# Patient Record
Sex: Female | Born: 1960 | Race: White | Hispanic: No | Smoking: Current every day smoker
Health system: Southern US, Community
[De-identification: ages and names within clinical notes are randomized; demographics above are authoritative.]

## PROBLEM LIST (undated history)

## (undated) DIAGNOSIS — R51 Headache: Secondary | ICD-10-CM

## (undated) DIAGNOSIS — I639 Cerebral infarction, unspecified: Secondary | ICD-10-CM

## (undated) DIAGNOSIS — Z5189 Encounter for other specified aftercare: Secondary | ICD-10-CM

## (undated) DIAGNOSIS — IMO0002 Reserved for concepts with insufficient information to code with codable children: Secondary | ICD-10-CM

## (undated) DIAGNOSIS — I10 Essential (primary) hypertension: Secondary | ICD-10-CM

## (undated) DIAGNOSIS — C539 Malignant neoplasm of cervix uteri, unspecified: Secondary | ICD-10-CM

## (undated) DIAGNOSIS — D126 Benign neoplasm of colon, unspecified: Secondary | ICD-10-CM

## (undated) DIAGNOSIS — G8929 Other chronic pain: Secondary | ICD-10-CM

## (undated) DIAGNOSIS — I2510 Atherosclerotic heart disease of native coronary artery without angina pectoris: Secondary | ICD-10-CM

## (undated) DIAGNOSIS — T7840XA Allergy, unspecified, initial encounter: Secondary | ICD-10-CM

## (undated) DIAGNOSIS — R519 Headache, unspecified: Secondary | ICD-10-CM

## (undated) DIAGNOSIS — E785 Hyperlipidemia, unspecified: Secondary | ICD-10-CM

## (undated) DIAGNOSIS — I7409 Other arterial embolism and thrombosis of abdominal aorta: Secondary | ICD-10-CM

## (undated) HISTORY — DX: Encounter for other specified aftercare: Z51.89

## (undated) HISTORY — DX: Headache: R51

## (undated) HISTORY — DX: Allergy, unspecified, initial encounter: T78.40XA

## (undated) HISTORY — DX: Cerebral infarction, unspecified: I63.9

## (undated) HISTORY — DX: Headache, unspecified: R51.9

## (undated) HISTORY — DX: Hyperlipidemia, unspecified: E78.5

## (undated) HISTORY — PX: CARDIAC VALVE SURGERY: SHX40

## (undated) HISTORY — DX: Essential (primary) hypertension: I10

## (undated) HISTORY — DX: Reserved for concepts with insufficient information to code with codable children: IMO0002

## (undated) HISTORY — DX: Other chronic pain: G89.29

## (undated) HISTORY — DX: Malignant neoplasm of cervix uteri, unspecified: C53.9

## (undated) HISTORY — PX: EYE SURGERY: SHX253

## (undated) HISTORY — PX: COLONOSCOPY: SHX174

## (undated) HISTORY — PX: ABDOMINAL HYSTERECTOMY: SHX81

## (undated) HISTORY — DX: Benign neoplasm of colon, unspecified: D12.6

---

## 2001-10-22 ENCOUNTER — Emergency Department (HOSPITAL_COMMUNITY): Admission: EM | Admit: 2001-10-22 | Discharge: 2001-10-22 | Payer: Self-pay | Admitting: Emergency Medicine

## 2003-01-03 ENCOUNTER — Emergency Department (HOSPITAL_COMMUNITY): Admission: EM | Admit: 2003-01-03 | Discharge: 2003-01-03 | Payer: Self-pay | Admitting: Emergency Medicine

## 2003-03-06 ENCOUNTER — Emergency Department (HOSPITAL_COMMUNITY): Admission: EM | Admit: 2003-03-06 | Discharge: 2003-03-06 | Payer: Self-pay | Admitting: Emergency Medicine

## 2003-03-07 ENCOUNTER — Encounter: Payer: Self-pay | Admitting: Emergency Medicine

## 2003-03-07 ENCOUNTER — Emergency Department (HOSPITAL_COMMUNITY): Admission: EM | Admit: 2003-03-07 | Discharge: 2003-03-07 | Payer: Self-pay | Admitting: Emergency Medicine

## 2003-11-06 ENCOUNTER — Emergency Department (HOSPITAL_COMMUNITY): Admission: EM | Admit: 2003-11-06 | Discharge: 2003-11-07 | Payer: Self-pay | Admitting: Emergency Medicine

## 2004-07-21 ENCOUNTER — Emergency Department (HOSPITAL_COMMUNITY): Admission: EM | Admit: 2004-07-21 | Discharge: 2004-07-22 | Payer: Self-pay | Admitting: Emergency Medicine

## 2004-07-22 ENCOUNTER — Emergency Department (HOSPITAL_COMMUNITY): Admission: EM | Admit: 2004-07-22 | Discharge: 2004-07-22 | Payer: Self-pay | Admitting: Emergency Medicine

## 2005-05-27 ENCOUNTER — Emergency Department (HOSPITAL_COMMUNITY): Admission: EM | Admit: 2005-05-27 | Discharge: 2005-05-27 | Payer: Self-pay | Admitting: Emergency Medicine

## 2005-08-06 ENCOUNTER — Emergency Department (HOSPITAL_COMMUNITY): Admission: EM | Admit: 2005-08-06 | Discharge: 2005-08-06 | Payer: Self-pay | Admitting: Emergency Medicine

## 2005-10-17 ENCOUNTER — Emergency Department (HOSPITAL_COMMUNITY): Admission: EM | Admit: 2005-10-17 | Discharge: 2005-10-17 | Payer: Self-pay | Admitting: Emergency Medicine

## 2008-03-04 ENCOUNTER — Emergency Department (HOSPITAL_COMMUNITY): Admission: EM | Admit: 2008-03-04 | Discharge: 2008-03-04 | Payer: Self-pay | Admitting: Emergency Medicine

## 2010-02-20 ENCOUNTER — Encounter: Admission: RE | Admit: 2010-02-20 | Discharge: 2010-02-20 | Payer: Self-pay | Admitting: Internal Medicine

## 2010-02-25 ENCOUNTER — Encounter: Admission: RE | Admit: 2010-02-25 | Discharge: 2010-02-25 | Payer: Self-pay | Admitting: Internal Medicine

## 2010-07-14 DIAGNOSIS — I639 Cerebral infarction, unspecified: Secondary | ICD-10-CM

## 2010-07-14 HISTORY — DX: Cerebral infarction, unspecified: I63.9

## 2010-12-07 ENCOUNTER — Inpatient Hospital Stay (HOSPITAL_COMMUNITY)
Admission: EM | Admit: 2010-12-07 | Discharge: 2010-12-09 | DRG: 948 | Disposition: A | Discharge status: Home / Self Care | Payer: Self-pay | Attending: Internal Medicine | Admitting: Internal Medicine

## 2010-12-07 ENCOUNTER — Emergency Department (HOSPITAL_COMMUNITY): Payer: Self-pay

## 2010-12-07 DIAGNOSIS — J309 Allergic rhinitis, unspecified: Secondary | ICD-10-CM | POA: Diagnosis present

## 2010-12-07 DIAGNOSIS — E876 Hypokalemia: Secondary | ICD-10-CM | POA: Diagnosis present

## 2010-12-07 DIAGNOSIS — Z8541 Personal history of malignant neoplasm of cervix uteri: Secondary | ICD-10-CM

## 2010-12-07 DIAGNOSIS — F17200 Nicotine dependence, unspecified, uncomplicated: Secondary | ICD-10-CM | POA: Diagnosis present

## 2010-12-07 DIAGNOSIS — I10 Essential (primary) hypertension: Secondary | ICD-10-CM | POA: Diagnosis present

## 2010-12-07 DIAGNOSIS — D72829 Elevated white blood cell count, unspecified: Secondary | ICD-10-CM | POA: Diagnosis present

## 2010-12-07 DIAGNOSIS — R413 Other amnesia: Principal | ICD-10-CM | POA: Diagnosis present

## 2010-12-07 LAB — COMPREHENSIVE METABOLIC PANEL
ALT: 8 U/L (ref 0–35)
AST: 16 U/L (ref 0–37)
Albumin: 4.1 g/dL (ref 3.5–5.2)
Alkaline Phosphatase: 121 U/L — ABNORMAL HIGH (ref 39–117)
BUN: 7 mg/dL (ref 6–23)
CO2: 23 mEq/L (ref 19–32)
Calcium: 9.4 mg/dL (ref 8.4–10.5)
Chloride: 97 mEq/L (ref 96–112)
Creatinine, Ser: 0.57 mg/dL (ref 0.4–1.2)
Glucose, Bld: 174 mg/dL — ABNORMAL HIGH (ref 70–99)
Potassium: 2.4 mEq/L — CL (ref 3.5–5.1)
Total Bilirubin: 0.4 mg/dL (ref 0.3–1.2)
Total Protein: 7 g/dL (ref 6.0–8.3)

## 2010-12-07 LAB — URINALYSIS, ROUTINE W REFLEX MICROSCOPIC
Bilirubin Urine: NEGATIVE
Ketones, ur: 15 mg/dL — AB
Leukocytes, UA: NEGATIVE
Nitrite: NEGATIVE
Protein, ur: NEGATIVE mg/dL
Specific Gravity, Urine: 1.015 (ref 1.005–1.030)
Urobilinogen, UA: 0.2 mg/dL (ref 0.0–1.0)
pH: 5.5 (ref 5.0–8.0)

## 2010-12-07 LAB — RAPID URINE DRUG SCREEN, HOSP PERFORMED
Benzodiazepines: NOT DETECTED
Cocaine: NOT DETECTED
Tetrahydrocannabinol: POSITIVE — AB

## 2010-12-07 LAB — DIFFERENTIAL
Basophils Absolute: 0 10*3/uL (ref 0.0–0.1)
Basophils Relative: 0 % (ref 0–1)
Eosinophils Relative: 0 % (ref 0–5)
Lymphocytes Relative: 14 % (ref 12–46)
Monocytes Absolute: 0.8 10*3/uL (ref 0.1–1.0)
Monocytes Relative: 4 % (ref 3–12)
Neutro Abs: 15.3 10*3/uL — ABNORMAL HIGH (ref 1.7–7.7)
Neutrophils Relative %: 81 % — ABNORMAL HIGH (ref 43–77)

## 2010-12-07 LAB — POCT PREGNANCY, URINE: Preg Test, Ur: NEGATIVE

## 2010-12-07 LAB — CK TOTAL AND CKMB (NOT AT ARMC)
CK, MB: 3.2 ng/mL (ref 0.3–4.0)
Relative Index: 1.3 (ref 0.0–2.5)
Total CK: 251 U/L — ABNORMAL HIGH (ref 7–177)

## 2010-12-07 LAB — CBC
HCT: 38.7 % (ref 36.0–46.0)
Hemoglobin: 13.7 g/dL (ref 12.0–15.0)
MCH: 30.5 pg (ref 26.0–34.0)
MCHC: 35.4 g/dL (ref 30.0–36.0)
RBC: 4.49 MIL/uL (ref 3.87–5.11)
RDW: 13.1 % (ref 11.5–15.5)
WBC: 18.9 10*3/uL — ABNORMAL HIGH (ref 4.0–10.5)

## 2010-12-07 LAB — SALICYLATE LEVEL: Salicylate Lvl: <2 mg/dL — ABNORMAL LOW (ref 2.8–20.0)

## 2010-12-07 LAB — PROTIME-INR
INR: 1.02 (ref 0.00–1.49)
Prothrombin Time: 13.6 seconds (ref 11.6–15.2)

## 2010-12-07 LAB — AMMONIA: Ammonia: 26 umol/L (ref 11–60)

## 2010-12-07 LAB — URINE MICROSCOPIC-ADD ON

## 2010-12-07 LAB — TROPONIN I: Troponin I: <0.3 ng/mL (ref <0.30)

## 2010-12-07 LAB — APTT: aPTT: 29 seconds (ref 24–37)

## 2010-12-07 LAB — OSMOLALITY: Osmolality: 273 mOsm/kg — ABNORMAL LOW (ref 275–300)

## 2010-12-07 LAB — ACETAMINOPHEN LEVEL: Acetaminophen (Tylenol), Serum: <15 ug/mL (ref 10–30)

## 2010-12-08 ENCOUNTER — Inpatient Hospital Stay (HOSPITAL_COMMUNITY): Payer: Self-pay

## 2010-12-08 LAB — BASIC METABOLIC PANEL
CO2: 24 mEq/L (ref 19–32)
Calcium: 8.9 mg/dL (ref 8.4–10.5)
Chloride: 106 mEq/L (ref 96–112)
GFR calc Af Amer: >60 mL/min (ref >60)
GFR calc non Af Amer: >60 mL/min (ref >60)
Glucose, Bld: 126 mg/dL — ABNORMAL HIGH (ref 70–99)
Glucose, Bld: 152 mg/dL — ABNORMAL HIGH (ref 70–99)
Potassium: 3.8 mEq/L (ref 3.5–5.1)
Sodium: 141 mEq/L (ref 135–145)
Sodium: 141 mEq/L (ref 135–145)

## 2010-12-08 LAB — CBC
HCT: 38 % (ref 36.0–46.0)
MCHC: 35.3 g/dL (ref 30.0–36.0)
RDW: 13.1 % (ref 11.5–15.5)

## 2010-12-08 LAB — FOLATE: Folate: 14.7 ng/mL

## 2010-12-08 LAB — CARDIAC PANEL(CRET KIN+CKTOT+MB+TROPI)
CK, MB: 3.1 ng/mL (ref 0.3–4.0)
Total CK: 209 U/L — ABNORMAL HIGH (ref 7–177)
Troponin I: <0.3 ng/mL (ref <0.30)

## 2010-12-08 LAB — VITAMIN B12: Vitamin B-12: 285 pg/mL (ref 211–911)

## 2010-12-08 MED ORDER — GADOBENATE DIMEGLUMINE 529 MG/ML IV SOLN
10.0000 mL | Freq: Once | INTRAVENOUS | Status: AC | PRN
  Administered 2010-12-08: 10 mL via INTRAVENOUS

## 2010-12-08 NOTE — H&P (Signed)
NAMEGUSTIE, BOBB                ACCOUNT NO.:  [id number]  MEDICAL RECORD NO.:  [id number]           PATIENT TYPE:  E  LOCATION:  [facility]                         FACILITY:  [facility]  PHYSICIAN:  Hilary Hertz, MD      DATE OF BIRTH:  [birth year]  DATE OF ADMISSION:  12/07/2010                              HISTORY & PHYSICAL   PRIMARY CARE PHYSICIAN:  Unassigned.  CHIEF COMPLAINT:  Memory loss/Altered mental status.  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  Basically what happened today was the daughter was visiting her mom, the patient's husband was also at home.  The daughter and husband left and when the husband returned the patient was confused, disoriented and was unable to remember what she had done that day or the day previous and was "unstable on her feet" and so she was brought to the emergency room. At this time, the patient states she has a slight headache but no other complaints and does not know why she is in the hospital.  She is also not able to say the date.  She has no other acute complaints.  Per the patient's daughter the patient did not remember doing any of the events of the day including taking her grandchild to [employer].  The patient smokes occasional marijuana and does report using marijuana 2 days ago.  REVIEW OF SYSTEMS:  Review of 10 organ systems was negative except as stated above in the HPI.  ALLERGIES:  No known drug allergies.  MEDICATIONS:  The patient does not take any prescribed medications at this time although she does say that she takes a heart healthy vitamin over-the-counter and fish oil.  PAST MEDICAL HISTORY/PAST SURGICAL HISTORY:  Hysterectomy, patent  ductus arteriosus, repair the child cervical cancer, and questionable history of hypertension, not on medications.  SOCIAL HISTORY:  The patient is a one-half pack per day smoker for the past 40 years.  She was a heavy drinker, but none in 20 years.  She denies any recent alcohol use.  The patient denied any history of drug use although when confronted with the result of her urine drug screen showing marijuana the patient admitted that she does occasionally smoke marijuana, but she said that the last time was 2 days ago.  FAMILY HISTORY:  Mother and brother with hypertension.  PHYSICAL EXAMINATION:  VITAL SIGNS:  Blood pressure 142/75, pulse 85, respirations 20, temperature 98.2, and oxygen saturation 99%. GENERAL:  No acute distress. HEENT:  Mucous membranes moist.  No sinus tenderness. CV: Regular rate and rhythm.  No murmurs, rubs, or gallops. LUNGS:  Clear to auscultation bilaterally.  Nonlabored respiratory effort. ABD: soft, NTND Extr:  no edema SKIN:  No rashes. NEURO:  Alert and oriented to name and place, but not to time.  The patient is unable to state what the date is.  She also appears to have short-term memory loss as I introduced myself and told her I was the person admitting her to the hospital, then later when I told her again that she would be admitted to the hospital, she said "what?! I am being admitted to the hospital?"  She is having short-term memory issues. Cranial nerves II-XII are grossly intact.  Strength is 5/5 in all extremities.  Sensation grossly intact.  LABORATORY DATA:  Pregnancy test is negative.  CBC, white count is 18.9, hemoglobin 13.7, platelets 298.  INR is 1.92.  UA shows moderate blood, negative protein, negative nitrites, negative leukocyte esterase. Microscopic is normal.  Troponin is less than 0.30.  Tylenol level is less than 15.0.  Sodium 135, potassium 2.4, chloride 97, bicarb 23, glucose 174, BUN 7, creatinine 0.57, AST 16, ALT 8.   Alcohol is less than 11, so slightly above or normal range of 0 to 10, CK 251, CK-MB 83.2, aspirin level less than 2.0, ammonia is 26.  Drug strain is positive for THC.  EKG, sinus rhythm rate of 88, LVH.  CT of the head no acute intracranial findings, small amount of fluid in the sphenoid sinuses.  Chest x-ray, no acute cardiopulmonary process, findings is stable COPD and mild stable biapical scarring is noted.  ASSESSMENT AND PLAN:  The patient is a 50 year old woman with a questionable history of hypertension not on medications, prior history of polysubstance abuse, currently using occasional marijuana and history of patent ductus arteriosus closure as a child and a prior history of cervical cancer, who presents with memory loss today. 1. Altered mental status.  At this time, the etiology of the patient     what appears to the patient's short-term memory loss is unclear     this could be due to possible polysubstance abuse versus infection     given her white count versus possible CVA given the patient's     history of untreated hypertension.  At this time, the head CT is     negative and her neuro exam is normal.  We will check an MRI brain with and without contrast.  We will     also discuss the case with Neurology and see if they feel an LP is     warranted.  We will do an infectious workup with blood cultures, UA     and urine cultures.  Chest x-ray does not show any signs of     pneumonia.  Given the patient's nonspecific symptoms, we will follow     cardiac enzymes and put the patient on telemetry.  The patient's urine drug screen is positive for THC though she does not have any other substances in her system, does not appear to be an overdose picture or toxic ingestion.  We will check a B12 and folate level and Neurology has recommended a routine EEG which will be ordered. 1. Hypokalemia, unclear etiology.  The patient was given potassium     in the ED.  We will recheck her  potassium tonight and continue to     supplement if needed. 2. Probable hypertension, not on medications.  We will continue to     monitor the patient's blood pressure while she is in-house and     consider starting her on therapy. 3. Fluid, electrolyte, nutrition.  We will put the patient on a     general diet.  Replace electrolytes as above and give her 1 liter     saline bolus  given ketones in her urine. 4. Prophylaxis.  We will put the patient on SCDs until MRI is done, if     no signs of stroke relating we will put the patient on chemical     prophylaxis for DVT.  DISPOSITION:  The patient will be admitted to [facility] Team 5 for further workup.          ______________________________ Hilary Hertz, MD    JF/MEDQ  D:  12/07/2010  T:  12/07/2010  Job:  [id number]  Electronically Signed by Hilary Hertz MD on 12/08/2010 03:51:05 AM

## 2010-12-09 LAB — BASIC METABOLIC PANEL
Chloride: 106 mEq/L (ref 96–112)
GFR calc Af Amer: >60 mL/min (ref >60)
GFR calc non Af Amer: >60 mL/min (ref >60)
Potassium: 4.1 mEq/L (ref 3.5–5.1)
Sodium: 140 mEq/L (ref 135–145)

## 2010-12-09 LAB — URINE CULTURE
Culture  Setup Time: 201205270212
Culture: NO GROWTH

## 2010-12-09 LAB — CBC
MCV: 87.8 fL (ref 78.0–100.0)
Platelets: 271 10*3/uL (ref 150–400)
RBC: 4.5 MIL/uL (ref 3.87–5.11)
RDW: 13.3 % (ref 11.5–15.5)
WBC: 9.6 10*3/uL (ref 4.0–10.5)

## 2010-12-11 NOTE — Discharge Summary (Signed)
NAMEEMBERLEE, Kristina Mayer NO.:  [id number]  MEDICAL RECORD NO.:  [id number]           PATIENT TYPE:  I  LOCATION:  4742                         FACILITY:  [facility]  PHYSICIAN:  Rosanna Randy, MDDATE OF BIRTH:  12-21-1960  DATE OF ADMISSION:  12/07/2010 DATE OF DISCHARGE:  12/09/2010                              DISCHARGE SUMMARY   DISCHARGE DIAGNOSES: 1. Transient memory loss thought to be secondary to concussion. 2. Hypertension. 3. Hypokalemia. 4. Tobacco abuse. 5. Marijuana abuse. 6. Reactive leukocytosis. 7. Allergic rhinitis. 8. History of hysterectomy. 9. History of patent ductus arteriosus repair while she was a child. 10.Cervical cancer. 11.A low normal vitamin B12 with MCV of 86.7 and a prior history of     alcohol.  DISCHARGE MEDICATIONS: 1. Amlodipine 5 mg 1 tablet by mouth daily. 2. Loratadine 10 mg 1 tablet by mouth daily. 3. Fish oil 1 capsule by mouth daily. 4. Multivitamins over the counter 1 tablet by mouth daily.  DISPOSITION AND FOLLOWUP:  The patient had been discharged in stable and improved condition currently not complaining of any altered mental status, any headache, chest pain, shortness of breath or any other acute complaints.  After discussing with Dr. Thad Ranger, neurologist, the decision of not waiting for an electroencephalogram to be done while the patient is in the hospital was made and the recommendation is once she follow with the primary care physician to determine if needed to arrange for an electroencephalogram as an outpatient.  At this moment, the patient is not having any acute complaints and her mental status is completely back to baseline.  The patient is alert, awake and oriented x4 without any neurologic deficit.  It is going to be important during her follow-up appointment to make sure that the patient has not presented with any recurrence in mental status changes or any symptoms and it will be also  important to review a basic metabolic panel in order to check for the patient's renal function and electrolytes while also adjusting the patient's antihypertensive drugs if needed.  PROCEDURE PERFORMED DURING THIS HOSPITALIZATION: 1. The patient had a CT of the head without contrast on Dec 07, 2010,     that demonstrated no acute intracranial findings with just a small     amount of fluid in the sphenoid sinuses. 2. The patient had a chest x-ray on Dec 07, 2010, that demonstrated no     acute cardiopulmonary process with findings of COPD and mild stable     biapical scarring. 3. The patient had an MRI of the brain on Dec 08, 2010, that     demonstrated no acute infarct with mild-to-moderate patchy and     punctate nonspecific white matter type changes.  No other     procedures were performed during this hospitalization.  Neurology     was consulted.  BRIEF HISTORY OF PRESENT ILLNESS:  For full details please refer to dictation of the history and physical on admission done by Dr. Hilary Hertz on Dec 08, 2010, but briefly the patient is a 50 year old woman with a history of patent ductus arteriosus status post  closure in childhood, remote history of cervical cancer and questionable history of hypertension currently not taking any medications who presented with a memory loss today, also the history is provided by the patient's daughter in addition to the patient.  Basically what happened today was the daughter was visiting her mother, the patient's husband was also at home.  The daughter and husband left and when the husband returned the patient was confused, disoriented and was unable to remember what she had done that day or the day prior to admission.  The patient was also unstable on her feet and so she was brought to the emergency room.  PERTINENT LABORATORY DATA DURING THIS HOSPITALIZATION:  The patient had a TSH that demonstrated a 3.156 value, negative urine culture,  blood cultures no growth up to the moment of discharge, blood culture taken on Dec 07, 2010.  The patient had a BMET with a sodium of 140, potassium 2.4, chloride 97, bicarb 23, glucose 174, BUN 7, creatinine 0.57. Cardiac markers negative x3.  Alcohol level less than 11.  Salicylate less than 2.  Ammonia level 26.  Urine drug screen positive for tetrahydrocannabinol.  Vitamin B12 285, folic acid 14.7.  HOSPITAL COURSE BY PROBLEM: 1. The patient's recent transient memory loss thought to be secondary     to concussion since full history provided once the patient was     fully alert and awake and oriented.  On that day, she actually had     history of fall, hitting her head that might contribute to the     transient memory loss.  Full workup done inside the hospital for     transient ischemic attack and stroke were completely negative.     There was no seizure activity or any other abnormalities seen at     this moment after discussion with Neurology.  They have recommended     there is no reason for LP to be done and they have stated that an     EEG can be pursued as an outpatient if symptoms recur.  We are     going to provide control of her blood pressure and the patient had     been advised to stop using illicit drugs and also alcohol. 2. Hypertension.  The patient has been started on Norvasc and had been     advised to follow a low-sodium diet and to follow with primary care     physician for further adjustment of her medications and treatment     of her blood pressure. 3. Hypokalemia which was repleted throughout this hospitalization.     Magnesium in normal range.  Discharge electrolytes were normal. 4. Tobacco abuse.  The patient received smoking cessation counseling     and throughout this hospitalization she also wore a nicotine patch.     The patient had been advised to stop smoking and outpatient     resources had been provided to help her quit. 5. Reactive leukocytosis on  admission with a CBC of 18.9 which     completely resolved just with fluid resuscitation.  X-ray, blood     cultures, and urine cultures remained completely negative     throughout this admission. 6. Allergic rhinitis.  She had been started on loratadine 10 mg by     mouth daily and had been advised to quit smoking.  PHYSICAL EXAMINATION:  VITAL SIGNS:  At discharge, the patient's temperature 98.5, heart rate 64, respiratory rate 18, blood pressure 112/76,  oxygen saturation 98% on room air. GENERAL:  The patient in no acute distress, alert, awake, oriented x4. RESPIRATORY SYSTEM:  Clear to auscultation bilaterally.  No wheezing. HEART:  Regular rate, no murmur. ABDOMEN:  Soft, nontender and nondistended.  Positive bowel sounds. EXTREMITIES:  Without any edema. NEUROLOGIC:  The patient was alert and fully oriented.  She was able to follow commands without any difficulty.  Her speech was fluent. Extraocular muscles intact.  PERRLA.  Smile was symmetric.  Tongue and uvula midline.  Strength 5/5 bilaterally symmetrically.  Finger-to-nose and also heel-to-shin were intact.  No focal deficit appreciated.  The patient had normal light touch and also pinprick sensation.  At discharge, the patient's laboratory data demonstrated white blood cells of 9.6, hemoglobin 13.7, platelets 271.  Sodium 140, potassium 4.1, chloride 106, bicarb 27, BUN 6, creatinine 0.47, blood sugar 92.  PLAN:  At this moment I may feel malonic acid was ordered but is still pending.  We are going to track on these results and if it is elevated we are going to communicate with primary care physician in order to start repletion for the patient's B12.     Rosanna Randy, MD     CEM/MEDQ  D:  12/09/2010  T:  12/09/2010  Job:  [id number]  cc:   [facility]  Electronically Signed by Vassie Loll MD on 12/11/2010 01:34:49 PM

## 2010-12-14 LAB — CULTURE, BLOOD (ROUTINE X 2): Culture: NO GROWTH

## 2010-12-22 LAB — MANGANESE: Manganese, Blood: 0.5 mcg/L (ref <=1.1)

## 2011-06-18 ENCOUNTER — Encounter: Payer: Self-pay | Admitting: Gastroenterology

## 2011-07-15 DIAGNOSIS — D126 Benign neoplasm of colon, unspecified: Secondary | ICD-10-CM

## 2011-07-15 HISTORY — DX: Benign neoplasm of colon, unspecified: D12.6

## 2011-07-31 ENCOUNTER — Ambulatory Visit (INDEPENDENT_AMBULATORY_CARE_PROVIDER_SITE_OTHER): Payer: Self-pay | Admitting: Gastroenterology

## 2011-07-31 ENCOUNTER — Encounter: Payer: Self-pay | Admitting: Gastroenterology

## 2011-07-31 VITALS — BP 132/68 | HR 74 | Ht 65.0 in | Wt 112.0 lb

## 2011-07-31 DIAGNOSIS — R195 Other fecal abnormalities: Secondary | ICD-10-CM

## 2011-07-31 DIAGNOSIS — R634 Abnormal weight loss: Secondary | ICD-10-CM

## 2011-07-31 MED ORDER — PEG-KCL-NACL-NASULF-NA ASC-C 100 G PO SOLR: 1.0000 | Freq: Once | ORAL | Status: DC

## 2011-07-31 NOTE — Progress Notes (Signed)
History of Present Illness: This is a 51 year old female here today with her daughter who was recently found to have Hemoccult-positive stools. The patient has a long history of intermittent mild diarrhea related to certain foods such as cheese. She was hospitalized in April 2012 for acute mental status changes felt to be secondary to a concussion per the discharge summary. The patient relates that she had a stroke at that time but that is not documented in the hospital records. Since that time has had less appetite and has noted an 11 pound weight loss. She has occasional hemorrhoidal swelling and itching but denies rectal bleeding. Denies abdominal pain, constipation, change in stool caliber, melena, hematochezia, nausea, vomiting, dysphagia, reflux symptoms, chest pain.  No Known Allergies No outpatient prescriptions prior to visit.   Past Medical History  Diagnosis Date  . Hypertension   . Hyperlipidemia   . Allergic rhinitis   . Cervical cancer   . Chronic headaches   . Stroke 2012    Past Surgical History  Procedure Date  . Abdominal hysterectomy   . Cardiac valve surgery    History   Social History  . Marital Status: Married    Spouse Name: N/A    Number of Children: 2  . Years of Education: N/A   Occupational History  . Unemployed     Social History Main Topics  . Smoking status: Current Everyday Smoker  . Smokeless tobacco: Never Used   Comment: Counseling sheet given to patient in exam room   . Alcohol Use: No  . Drug Use: No  . Sexually Active: None   Other Topics Concern  . None   Social History Narrative   Daily caffeine    Family History  Problem Relation Age of Onset  . Colon cancer Paternal Aunt   . Esophageal cancer Maternal Grandfather     Review of Systems: Pertinent positive and negative review of systems were noted in the above HPI section. All other review of systems were otherwise negative.   Physical Exam: General: Well developed , well  nourished, no acute distress Head: Normocephalic and atraumatic Eyes:  sclerae anicteric, EOMI Ears: Normal auditory acuity Mouth: No deformity or lesions Neck: Supple, no masses or thyromegaly Lungs: Clear throughout to auscultation Heart: Regular rate and rhythm; no murmurs, rubs or bruits Abdomen: Soft, non tender and non distended. No masses, hepatosplenomegaly or hernias noted. Normal Bowel sounds Rectal: Deferred to colonoscopy Musculoskeletal: Symmetrical with no gross deformities  Skin: No lesions on visible extremities Pulses:  Normal pulses noted Extremities: No clubbing, cyanosis, edema or deformities noted Neurological: Alert oriented x 4, grossly nonfocal Cervical Nodes:  No significant cervical adenopathy Inguinal Nodes: No significant inguinal adenopathy Psychological:  Alert and cooperative. Normal mood and affect  Assessment and Recommendations:  1. Hemoccult positive stool and weight loss. Occult blood in stool possibly secondary to hemorrhoids. Rule out colorectal neoplasms. Schedule colonoscopy. The risks, benefits, and alternatives to colonoscopy with possible biopsy and possible polypectomy were discussed with the patient and they consent to proceed.

## 2011-07-31 NOTE — Patient Instructions (Addendum)
You have been given a separate informational sheet regarding your tobacco use, the importance of quitting and local resources to help you quit.  You have been scheduled for a Colonoscopy. See separate instructions. Pick up your prep kit from your pharmacy.  cc: Quitman Livings, MD

## 2011-08-14 ENCOUNTER — Ambulatory Visit (AMBULATORY_SURGERY_CENTER): Payer: Self-pay | Admitting: Gastroenterology

## 2011-08-14 ENCOUNTER — Encounter: Payer: Self-pay | Admitting: Gastroenterology

## 2011-08-14 VITALS — BP 146/94 | HR 88 | Temp 98.3°F | Resp 19 | Ht 65.0 in | Wt 112.0 lb

## 2011-08-14 DIAGNOSIS — R195 Other fecal abnormalities: Secondary | ICD-10-CM

## 2011-08-14 DIAGNOSIS — R634 Abnormal weight loss: Secondary | ICD-10-CM

## 2011-08-14 DIAGNOSIS — D126 Benign neoplasm of colon, unspecified: Secondary | ICD-10-CM

## 2011-08-14 MED ORDER — SODIUM CHLORIDE 0.9 % IV SOLN: 500.0000 mL | INTRAVENOUS | Status: DC

## 2011-08-14 NOTE — Progress Notes (Signed)
Patient did not experience any of the following events: a burn prior to discharge; a fall within the facility; wrong site/side/patient/procedure/implant event; or a hospital transfer or hospital admission upon discharge from the facility. (G8907) Patient did not have preoperative order for IV antibiotic SSI prophylaxis. (G8918)  

## 2011-08-14 NOTE — Progress Notes (Signed)
The pt had cramping with the scope advancement.  Sedation was titrated per Dr. Ardell Isaacs orders.  Once the cecum was reached, the pt relaxed and rested comfortably without complaints and her eyes closed. Maw  Blood pressure dropped to 88/61.  IV wide open drip.  Pt W,D,P and R.  Retook blood pressure 100/60, 104/67. Maw

## 2011-08-14 NOTE — Patient Instructions (Signed)
FOLLOW DISCHARGE INSTRUCTIONS (BLUE AND GREEN SHEETS). HOLD ASPIRIN, ASPIRIN PRODUCTS AND ANTI-INFLAMMATORY MEDS FOR THE NEXT TWO WEEKS.  NEXT COLONOSCOPY TO BE 3-10 YEARS DEPENING ON RESULTS OF PATHOLOGY REPORT OF POLYPS.

## 2011-08-14 NOTE — Op Note (Signed)
[facility] [address] [place], [place]  [address]  COLONOSCOPY PROCEDURE REPORT  PATIENT:  Kristina Mayer, Kristina Mayer  MR#:  [id number] BIRTHDATE:  March 27, 1961, 50 yrs. old  GENDER:  female ENDOSCOPIST:  Judie Petit T. Russella Dar, MD, [facility] Referred by:  Quitman Livings, M.D. PROCEDURE DATE:  08/14/2011 PROCEDURE:  Colonoscopy with biopsy and snare polypectomy ASA CLASS:  Class II INDICATIONS:  1) heme positive stool  2) weight loss MEDICATIONS:   These medications were titrated to patient response per physician's verbal order, Fentanyl 100 mcg IV, Versed 10 mg IV DESCRIPTION OF PROCEDURE:   After the risks benefits and alternatives of the procedure were thoroughly explained, informed consent was obtained.  Digital rectal exam was performed and revealed no abnormalities.   The LB [id number]-[id number] endoscope was introduced through the anus and advanced to the cecum, which was identified by both the appendix and ileocecal valve, without limitations.  The quality of the prep was excellent, using MoviPrep.  The instrument was then slowly withdrawn as the colon was fully examined. <<PROCEDUREIMAGES>> FINDINGS:  A sessile polyp was found in the mid transverse colon. It was 4 mm in size. The polyp was removed using cold biopsy forceps.  A pedunculated polyp was found in the sigmoid colon. It was 15 mm in size. Polyp was snared, then cauterized with monopolar cautery. Retrieval was successful. Otherwise normal colonoscopy without other polyps, masses, vascular ectasias, or inflammatory changes.  Retroflexed views in the rectum revealed internal hemorrhoids, small.  The time to cecum =  2  minutes. The scope was then withdrawn (time =  15  min) from the patient and the procedure completed.  COMPLICATIONS:  None  ENDOSCOPIC IMPRESSION: 1) 4 mm sessile polyp in the mid transverse colon 2) 15 mm pedunculated polyp in the sigmoid colon 3) Internal hemorrhoids  RECOMMENDATIONS: 1) Hold aspirin,  aspirin products, and anti-inflammatory medication for 2 weeks. 2) Await pathology results 3) Colonoscopy in 3 years if polyps are adenomatous, otherwise 10 years for routine screening.Judie Petit T. Russella Dar, MD, [facility]  n. eSIGNED:   Venita Lick. Stark at 08/14/2011 09:40 AM  Allegra Lai, [id number]

## 2011-08-15 ENCOUNTER — Telehealth: Payer: Self-pay | Admitting: *Deleted

## 2011-08-15 NOTE — Telephone Encounter (Signed)
  Follow up Call-  Call back number 08/14/2011  Post procedure Call Back phone  # [phone]  Permission to leave phone message Yes     Patient questions:  Do you have a fever, pain , or abdominal swelling? no Pain Score  0 *  Have you tolerated food without any problems? yes  Have you been able to return to your normal activities? yes  Do you have any questions about your discharge instructions: Diet   no Medications  no Follow up visit  no  Do you have questions or concerns about your Care? no  Actions: * If pain score is 4 or above: No action needed, pain <4.

## 2011-08-19 ENCOUNTER — Encounter: Payer: Self-pay | Admitting: Gastroenterology

## 2011-09-04 ENCOUNTER — Other Ambulatory Visit: Payer: Self-pay | Admitting: Gastroenterology

## 2012-04-14 ENCOUNTER — Encounter (HOSPITAL_COMMUNITY): Payer: Self-pay | Admitting: *Deleted

## 2012-04-20 ENCOUNTER — Ambulatory Visit (HOSPITAL_COMMUNITY)
Admission: RE | Admit: 2012-04-20 | Discharge: 2012-04-20 | Disposition: A | Discharge status: Home / Self Care | Payer: Self-pay | Source: Ambulatory Visit | Attending: Obstetrics and Gynecology | Admitting: Obstetrics and Gynecology

## 2012-04-20 ENCOUNTER — Encounter (HOSPITAL_COMMUNITY): Payer: Self-pay

## 2012-04-20 ENCOUNTER — Other Ambulatory Visit: Payer: Self-pay | Admitting: Obstetrics and Gynecology

## 2012-04-20 DIAGNOSIS — R2230 Localized swelling, mass and lump, unspecified upper limb: Secondary | ICD-10-CM

## 2012-04-20 DIAGNOSIS — Z1239 Encounter for other screening for malignant neoplasm of breast: Secondary | ICD-10-CM

## 2012-04-20 DIAGNOSIS — N63 Unspecified lump in unspecified breast: Secondary | ICD-10-CM

## 2012-04-22 ENCOUNTER — Ambulatory Visit
Admission: RE | Admit: 2012-04-22 | Discharge: 2012-04-22 | Disposition: A | Discharge status: Home / Self Care | Payer: No Typology Code available for payment source | Source: Ambulatory Visit | Attending: Obstetrics and Gynecology | Admitting: Obstetrics and Gynecology

## 2012-04-22 ENCOUNTER — Other Ambulatory Visit: Payer: Self-pay | Admitting: Obstetrics and Gynecology

## 2012-04-22 DIAGNOSIS — N63 Unspecified lump in unspecified breast: Secondary | ICD-10-CM

## 2012-05-03 NOTE — Patient Instructions (Signed)
Detailed notes scanned into EPIC under media. 

## 2012-05-03 NOTE — Progress Notes (Signed)
Detailed notes scanned into EPIC under media. 

## 2012-05-20 ENCOUNTER — Telehealth (HOSPITAL_COMMUNITY): Payer: Self-pay | Admitting: *Deleted

## 2012-05-20 NOTE — Telephone Encounter (Signed)
Telephoned patient at home # and left message to return call to [facility] 

## 2012-05-20 NOTE — Telephone Encounter (Signed)
Patient returned call and advised of benign diagnostic mammogram and recommendation screening mammogram in one year. Patient voiced understanding.

## 2012-06-23 NOTE — Addendum Note (Signed)
Encounter addended by: Saintclair Halsted, RN on: 06/23/2012  2:52 PM<BR>     Documentation filed: Charges VN

## 2013-04-14 ENCOUNTER — Other Ambulatory Visit: Payer: Self-pay | Admitting: Obstetrics and Gynecology

## 2013-04-14 DIAGNOSIS — Z1231 Encounter for screening mammogram for malignant neoplasm of breast: Secondary | ICD-10-CM

## 2013-05-10 ENCOUNTER — Ambulatory Visit (HOSPITAL_COMMUNITY)
Admission: RE | Admit: 2013-05-10 | Discharge: 2013-05-10 | Disposition: A | Discharge status: Home / Self Care | Payer: Self-pay | Source: Ambulatory Visit | Attending: Obstetrics and Gynecology | Admitting: Obstetrics and Gynecology

## 2013-05-10 ENCOUNTER — Encounter (HOSPITAL_COMMUNITY): Payer: Self-pay

## 2013-05-10 VITALS — BP 140/82 | Temp 98.3°F | Ht 65.0 in | Wt 102.8 lb

## 2013-05-10 DIAGNOSIS — Z1239 Encounter for other screening for malignant neoplasm of breast: Secondary | ICD-10-CM

## 2013-05-10 DIAGNOSIS — Z1231 Encounter for screening mammogram for malignant neoplasm of breast: Secondary | ICD-10-CM

## 2013-05-10 NOTE — Patient Instructions (Addendum)
Taught Kristina Mayer how to perform BSE and gave educational materials to take home. Patient did not need a Pap smear today due to last Pap smear was January 2013 per patient. Let patient know that since her hysterectomy has been 30 years ago and all her Pap smears have been normal since that she doesn't need any further Pap smears. Let patient know will follow up with her within the next couple weeks with results by letter or phone. Kristina Mayer verbalized understanding. Patient escorted to mammography for a screening mammogram.  [place]ann[place], Kathaleen Maser, RN 8:51 AM

## 2013-05-10 NOTE — Progress Notes (Addendum)
No complaints today.  Pap Smear:    Pap smear not completed today. Last Pap smear was January 2013 at [facility] and normal per patient. Per patient has a history of a hysterectomy 30 years ago due to cervical cancer. Patient stated all Pap smears since hysterectomy have been normal. No Pap smear results in EPIC.  Physical exam: Breasts Breasts symmetrical. No skin abnormalities bilateral breasts. No nipple retraction bilateral breasts. No nipple discharge bilateral breasts. No lymphadenopathy. No lumps palpated bilateral breasts. No complaints of pain or tenderness on exam. Patient escorted to mammography for a screening mammogram.        Pelvic/Bimanual No Pap smear completed today since last Pap smear was January 2013 and history of hysterectomy 30 years ago. Pap smear not indicated per [facility] guidelines.

## 2013-05-10 NOTE — Addendum Note (Signed)
Encounter addended by: Saintclair Halsted, RN on: 05/10/2013  8:58 AM<BR>     Documentation filed: Visit Diagnoses, Notes Section

## 2013-06-13 ENCOUNTER — Encounter (HOSPITAL_COMMUNITY): Payer: Self-pay | Admitting: Emergency Medicine

## 2013-06-13 ENCOUNTER — Telehealth: Payer: Self-pay | Admitting: Gastroenterology

## 2013-06-13 ENCOUNTER — Emergency Department (HOSPITAL_COMMUNITY)
Admission: EM | Admit: 2013-06-13 | Discharge: 2013-06-13 | Discharge status: Against Medical Advice | Payer: Self-pay | Attending: Emergency Medicine | Admitting: Emergency Medicine

## 2013-06-13 DIAGNOSIS — K625 Hemorrhage of anus and rectum: Secondary | ICD-10-CM | POA: Insufficient documentation

## 2013-06-13 DIAGNOSIS — Z8673 Personal history of transient ischemic attack (TIA), and cerebral infarction without residual deficits: Secondary | ICD-10-CM | POA: Insufficient documentation

## 2013-06-13 DIAGNOSIS — I10 Essential (primary) hypertension: Secondary | ICD-10-CM | POA: Insufficient documentation

## 2013-06-13 DIAGNOSIS — F17200 Nicotine dependence, unspecified, uncomplicated: Secondary | ICD-10-CM | POA: Insufficient documentation

## 2013-06-13 LAB — CBC WITH DIFFERENTIAL/PLATELET
Basophils Absolute: 0 10*3/uL (ref 0.0–0.1)
Basophils Relative: 0 % (ref 0–1)
Eosinophils Absolute: 0 10*3/uL (ref 0.0–0.7)
Eosinophils Relative: 0 % (ref 0–5)
HCT: 41.3 % (ref 36.0–46.0)
MCH: 32.6 pg (ref 26.0–34.0)
MCHC: 36.1 g/dL — ABNORMAL HIGH (ref 30.0–36.0)
MCV: 90.4 fL (ref 78.0–100.0)
Monocytes Absolute: 0.5 10*3/uL (ref 0.1–1.0)
Platelets: 306 10*3/uL (ref 150–400)
RDW: 13.1 % (ref 11.5–15.5)

## 2013-06-13 LAB — COMPREHENSIVE METABOLIC PANEL
ALT: 13 U/L (ref 0–35)
Albumin: 4 g/dL (ref 3.5–5.2)
Alkaline Phosphatase: 119 U/L — ABNORMAL HIGH (ref 39–117)
Chloride: 105 mEq/L (ref 96–112)
Glucose, Bld: 107 mg/dL — ABNORMAL HIGH (ref 70–99)
Potassium: 3.7 mEq/L (ref 3.5–5.1)
Sodium: 141 mEq/L (ref 135–145)
Total Bilirubin: 0.1 mg/dL — ABNORMAL LOW (ref 0.3–1.2)
Total Protein: 7.1 g/dL (ref 6.0–8.3)

## 2013-06-13 LAB — LIPASE, BLOOD: Lipase: 21 U/L (ref 11–59)

## 2013-06-13 NOTE — Telephone Encounter (Signed)
Agree with urgent ED evaluation. 

## 2013-06-13 NOTE — Telephone Encounter (Signed)
Patient reports 3-4 days of passing dark blood independent of stool and with multiple bloody BMs a day. She reports she "has been weak and dizzy for several days now".  She has a history of a colonoscopy 08/14/11.  She had 2 polyps removed and internal hemorrhoids. She is advised to go to the nearest ER for evaluation and not to drive herself.

## 2013-06-13 NOTE — ED Provider Notes (Signed)
Results for orders placed during the hospital encounter of 06/13/13  LIPASE, BLOOD      Result Value Range   Lipase 21  11 - 59 U/L  COMPREHENSIVE METABOLIC PANEL      Result Value Range   Sodium 141  135 - 145 mEq/L   Potassium 3.7  3.5 - 5.1 mEq/L   Chloride 105  96 - 112 mEq/L   CO2 23  19 - 32 mEq/L   Glucose, Bld 107 (*) 70 - 99 mg/dL   BUN 3 (*) 6 - 23 mg/dL   Creatinine, Ser 1.61  0.50 - 1.10 mg/dL   Calcium 9.3  8.4 - 09.6 mg/dL   Total Protein 7.1  6.0 - 8.3 g/dL   Albumin 4.0  3.5 - 5.2 g/dL   AST 16  0 - 37 U/L   ALT 13  0 - 35 U/L   Alkaline Phosphatase 119 (*) 39 - 117 U/L   Total Bilirubin 0.1 (*) 0.3 - 1.2 mg/dL   GFR calc non Af Amer >90  >90 mL/min   GFR calc Af Amer >90  >90 mL/min  CBC WITH DIFFERENTIAL      Result Value Range   WBC 10.6 (*) 4.0 - 10.5 K/uL   RBC 4.57  3.87 - 5.11 MIL/uL   Hemoglobin 14.9  12.0 - 15.0 g/dL   HCT 04.5  40.9 - 81.1 %   MCV 90.4  78.0 - 100.0 fL   MCH 32.6  26.0 - 34.0 pg   MCHC 36.1 (*) 30.0 - 36.0 g/dL   RDW 91.4  78.2 - 95.6 %   Platelets 306  150 - 400 K/uL   Neutrophils Relative % 59  43 - 77 %   Neutro Abs 6.3  1.7 - 7.7 K/uL   Lymphocytes Relative 36  12 - 46 %   Lymphs Abs 3.8  0.7 - 4.0 K/uL   Monocytes Relative 5  3 - 12 %   Monocytes Absolute 0.5  0.1 - 1.0 K/uL   Eosinophils Relative 0  0 - 5 %   Eosinophils Absolute 0.0  0.0 - 0.7 K/uL   Basophils Relative 0  0 - 1 %   Basophils Absolute 0.0  0.0 - 0.1 K/uL   No results found.  Pt was not in the room when I went in to assess her.  She apparently left AMA.  Rolan Bucco, MD 06/13/13 [phone]

## 2013-06-13 NOTE — ED Notes (Signed)
Unable to locate patient. EDP states that she has not seen pt in over a hour.

## 2013-06-13 NOTE — ED Notes (Signed)
Pt reports passing bright red bloody stool on Friday and began passing dark colored stool with some bright red blood in it last night. Pt also reports having a headache x3 days. Pt denies nausea, vomiting, abd or chest pain

## 2013-11-18 ENCOUNTER — Telehealth: Payer: Self-pay

## 2013-11-18 NOTE — Telephone Encounter (Signed)
Returned call to patient concerning our previous conversation from May 7th. Clarified for patient [facility] program and how it could help her and what would provide for her. Patient asked a lot of questions and voiced a lot of apprehension. Patient will come for [facility] appointment on May 15.

## 2013-11-25 ENCOUNTER — Ambulatory Visit: Payer: Self-pay

## 2013-11-25 ENCOUNTER — Other Ambulatory Visit: Payer: Self-pay

## 2013-12-09 ENCOUNTER — Other Ambulatory Visit: Payer: Self-pay

## 2013-12-09 ENCOUNTER — Ambulatory Visit (HOSPITAL_BASED_OUTPATIENT_CLINIC_OR_DEPARTMENT_OTHER): Payer: Self-pay

## 2013-12-09 VITALS — BP 154/88 | HR 86 | Temp 99.1°F | Resp 18 | Ht 63.5 in | Wt 109.7 lb

## 2013-12-09 DIAGNOSIS — Z0000 Encounter for general adult medical examination without abnormal findings: Secondary | ICD-10-CM

## 2013-12-09 LAB — HEMOGLOBIN A1C
HEMOGLOBIN A1C: 5.5 % (ref <5.7)
Mean Plasma Glucose: 111 mg/dL (ref <117)

## 2013-12-09 LAB — LIPID PANEL
CHOLESTEROL: 166 mg/dL (ref 0–200)
HDL: 43 mg/dL (ref >39)
LDL Cholesterol: 80 mg/dL (ref 0–99)
TRIGLYCERIDES: 215 mg/dL — AB (ref <150)
Total CHOL/HDL Ratio: 3.9 Ratio
VLDL: 43 mg/dL — ABNORMAL HIGH (ref 0–40)

## 2013-12-09 LAB — GLUCOSE (CC13): Glucose: 109 mg/dl (ref 70–140)

## 2013-12-09 NOTE — Progress Notes (Signed)
Patient is a new patient to the [facility] program and is currently a [facility] patient effective 04/20/2013.   Clinical Measurements: Patient is 5 ft. 3.5 inches, weight 109.7lbs, waist circumference 32 inches, and hip circumference 34 inches.   Medical History: Patient staes has a history of high cholesterol and takes lovastatin. Patient does have a history of hypertension that takes Amlodipine daily. Per patient has no history of diabetes or takes any medications for diabetes. Per patient has  history of coronary heart disease, heart attack, heart failure, stroke/TIA, vascular disease or congenital heart defects. 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  Blood Pressure, Self-measurement: Patient states that only checks blood pressure when has headache.  Nutrition Assessment: Patient stated eats a bannana daily. Patient states she loves vegetables eating at least 3 cups daily. Patient states does eat 3 or more ounces of whole grains daily. Patient doesn't eat two or more servings of fish weekly. Patient states does not drink more than 36 ounces or 450 calories of beverages with added sugars weekly. Patient stated she does watch her salt intake not adding salt to foods due to her history of hypertension.   Physical Activity Assessment: Patient stated she does around 1680 minutes of moderate exercise weekly including walking, taking care grandkids and household chores. Patient stated she does not do any vigorous physical activity on a regular basis.   Smoking Status: Patient is a current every day smoker who smokes 2 packs daily. Patient states she would like to quit. Patient states cannot do a day time class because of child care responsibilities.She is not in smoky areas.   Quality of Life Assessment: In assessing patient's quality of life she stated that out of the past 30 days that she has felt her health was not good a14 of them. Patient also stated that in the past 30 days that her  mental health was not good including stress, depression and problems with emotions. Patient did state that out of the past 30 days that around 15 of them she felt her physical or mental health kept her from doing her usual activities including self-care, work or recreation.   Plan: Lab work will be done today including a lipid panel, BMET and Hgb A1C. Patient will be referred to [facility] for follow up of high blood pressure and other problems.Will have [facility] to [facility]. Will call lab reslts on Monday. Will schedule health coaching.

## 2013-12-09 NOTE — Patient Instructions (Signed)
Discussed health assessment with patient. Will be called with results of lab work and will then discuss appointment for health coaching.Patient will implement behavior modifications to help lower BP. Patient verbalized understanding.

## 2013-12-22 ENCOUNTER — Ambulatory Visit: Payer: Self-pay | Admitting: Internal Medicine

## 2014-01-11 ENCOUNTER — Telehealth: Payer: Self-pay

## 2014-01-11 NOTE — Telephone Encounter (Signed)
Called to follow up on Health Coaching for Nutrition, smoking, and Activity. Patient stated that really needs to quit smoking. Patient stated that could not come in July. Patient asked to be enrolled in October [facility] Class. Patient stated that she is having car problems. Patient stated will call 7/6 and inform me about date for Health Coaching.  PLAN: Will enroll patient in August Quit Smart class. Will follow up with patient if does not call back.

## 2014-01-23 ENCOUNTER — Other Ambulatory Visit: Payer: Self-pay

## 2014-01-23 ENCOUNTER — Encounter: Payer: Self-pay | Admitting: Internal Medicine

## 2014-01-23 ENCOUNTER — Ambulatory Visit: Payer: Self-pay | Attending: Internal Medicine | Admitting: Internal Medicine

## 2014-01-23 ENCOUNTER — Ambulatory Visit (HOSPITAL_COMMUNITY)
Admission: RE | Admit: 2014-01-23 | Discharge: 2014-01-23 | Disposition: A | Discharge status: Home / Self Care | Payer: Self-pay | Source: Ambulatory Visit | Attending: Internal Medicine | Admitting: Internal Medicine

## 2014-01-23 VITALS — BP 160/80 | HR 81 | Temp 97.9°F | Resp 17 | Wt 112.2 lb

## 2014-01-23 DIAGNOSIS — Z006 Encounter for examination for normal comparison and control in clinical research program: Secondary | ICD-10-CM | POA: Insufficient documentation

## 2014-01-23 DIAGNOSIS — Z139 Encounter for screening, unspecified: Secondary | ICD-10-CM | POA: Insufficient documentation

## 2014-01-23 DIAGNOSIS — K625 Hemorrhage of anus and rectum: Secondary | ICD-10-CM | POA: Insufficient documentation

## 2014-01-23 DIAGNOSIS — F17200 Nicotine dependence, unspecified, uncomplicated: Secondary | ICD-10-CM | POA: Insufficient documentation

## 2014-01-23 DIAGNOSIS — I517 Cardiomegaly: Secondary | ICD-10-CM | POA: Insufficient documentation

## 2014-01-23 DIAGNOSIS — G43909 Migraine, unspecified, not intractable, without status migrainosus: Secondary | ICD-10-CM | POA: Insufficient documentation

## 2014-01-23 DIAGNOSIS — H538 Other visual disturbances: Secondary | ICD-10-CM | POA: Insufficient documentation

## 2014-01-23 DIAGNOSIS — I10 Essential (primary) hypertension: Secondary | ICD-10-CM | POA: Insufficient documentation

## 2014-01-23 DIAGNOSIS — E785 Hyperlipidemia, unspecified: Secondary | ICD-10-CM | POA: Insufficient documentation

## 2014-01-23 DIAGNOSIS — G40909 Epilepsy, unspecified, not intractable, without status epilepticus: Secondary | ICD-10-CM | POA: Insufficient documentation

## 2014-01-23 DIAGNOSIS — R079 Chest pain, unspecified: Secondary | ICD-10-CM | POA: Insufficient documentation

## 2014-01-23 MED ORDER — LISINOPRIL 5 MG PO TABS: 5.0000 mg | ORAL_TABLET | Freq: Every day | ORAL | Status: DC

## 2014-01-23 MED ORDER — CLONIDINE HCL 0.1 MG PO TABS
0.1000 mg | ORAL_TABLET | Freq: Once | ORAL | Status: AC
  Administered 2014-01-23: 0.1 mg via ORAL

## 2014-01-23 MED ORDER — BUTALBITAL-APAP-CAFFEINE 50-325-40 MG PO TABS: 1.0000 | ORAL_TABLET | Freq: Two times a day (BID) | ORAL | Status: DC | PRN

## 2014-01-23 MED ORDER — LORATADINE 10 MG PO TABS: 10.0000 mg | ORAL_TABLET | Freq: Every day | ORAL | Status: AC

## 2014-01-23 MED ORDER — AMLODIPINE BESYLATE 10 MG PO TABS: 10.0000 mg | ORAL_TABLET | Freq: Every day | ORAL | Status: DC

## 2014-01-23 MED ORDER — LOVASTATIN 20 MG PO TABS: 20.0000 mg | ORAL_TABLET | Freq: Every morning | ORAL | Status: DC

## 2014-01-23 NOTE — Progress Notes (Signed)
Patient Demographics  Kristina Mayer, is a 53 y.o. female  YQM:[id number]  BMW:[id number]  DOB - [birth year]  CC:  Chief Complaint  Patient presents with  . Establish Care       HPI: Kristina Mayer is a 53 y.o. female here today to establish medical care. Patient has history of hypertension hyperlipidemia migraine headaches environmental allergies heart valve surgery as child, she reported to have some chest pain on the left side which positional denies any recent or PND or leg swelling, EKG done today in the office is unchanged compared to last one but her blood pressure is elevated today she is on amlodipine 10 mg, she was given clonidine, repeat her blood pressure is 160/80, she also smokes cigarettes, patient is going to enroll in the smoking cessation program will. She is requesting refill on her medications. Her she is history of migraine headaches and is taking Norco, never been evaluated by neurologist. Patient also reported to have blurry vision as well as on and off rectal bleeding patient had a colonoscopy done in the past and was told she has polyps.  Patient has No headache, No chest pain, No abdominal pain - No Nausea, No new weakness tingling or numbness, No Cough - SOB.  No Known Allergies Past Medical History  Diagnosis Date  . Hypertension   . Hyperlipidemia   . Allergic rhinitis   . Cervical cancer   . Chronic headaches   . Stroke 2012   . Blood transfusion   . Cyst     by left eye   Current Outpatient Prescriptions on File Prior to Visit  Medication Sig Dispense Refill  . HYDROcodone-acetaminophen (NORCO/VICODIN) 5-325 MG per tablet Take 1 tablet by mouth daily as needed for moderate pain.       No current facility-administered medications on file prior to visit.   Family History  Problem Relation Age of Onset  . Colon cancer Paternal Aunt   . Esophageal cancer Maternal Grandfather   . Rectal cancer Neg Hx   . Stomach cancer Neg Hx   . Cancer Mother       brain  . Stroke Mother   . Hypertension Mother   . Hypertension Sister   . Stroke Maternal Uncle    History   Social History  . Marital Status: Married    Spouse Name: N/A    Number of Children: 2  . Years of Education: N/A   Occupational History  . Unemployed     Social History Main Topics  . Smoking status: Current Every Day Smoker -- 1.00 packs/day for 40 years  . Smokeless tobacco: Never Used     Comment: Counseling sheet given to patient in exam room   . Alcohol Use: No  . Drug Use: No  . Sexual Activity: Yes    Birth Control/ Protection: Surgical   Other Topics Concern  . Not on file   Social History Narrative   Daily caffeine     Review of Systems: Constitutional: Negative for fever, chills, diaphoresis, activity change, appetite change and fatigue. HENT: Negative for ear pain, nosebleeds, congestion, facial swelling, rhinorrhea, neck pain, neck stiffness and ear discharge.  Eyes: Negative for pain, discharge, redness, itching and visual disturbance. Respiratory: Negative for cough, choking, chest tightness, shortness of breath, wheezing and stridor.  Cardiovascular: Negative for chest pain, palpitations and leg swelling. Gastrointestinal: Negative for abdominal distention. Genitourinary: Negative for dysuria, urgency, frequency, hematuria, flank pain, decreased urine volume, difficulty urinating and dyspareunia.  Musculoskeletal: Negative for back pain, joint swelling, arthralgia and gait problem. Neurological: Negative for dizziness, tremors, seizures, syncope, facial asymmetry, speech difficulty, weakness, light-headedness, numbness and headaches.  Hematological: Negative for adenopathy. Does not bruise/bleed easily. Psychiatric/Behavioral: Negative for hallucinations, behavioral problems, confusion, dysphoric mood, decreased concentration and agitation.    Objective:   Filed Vitals:   01/23/14 1538  BP: 160/80  Pulse:   Temp:   Resp:      Physical Exam: Constitutional: Patient appears well-developed and well-nourished. No distress. HENT: Normocephalic, atraumatic, External right and left ear normal. Oropharynx is clear and moist.  Eyes: Conjunctivae and EOM are normal. PERRLA, no scleral icterus. Neck: Normal ROM. Neck supple. No JVD. No tracheal deviation. No thyromegaly. CVS: RRR, S1/S2 +, no murmurs, no gallops, no carotid bruit.  Pulmonary: Effort and breath sounds normal, no stridor, rhonchi, wheezes, rales.  Left chest wall tenderness  Abdominal: Soft. BS +, no distension, tenderness, rebound or guarding.  Musculoskeletal: Normal range of motion. No edema and no tenderness.  Neuro: Alert. Normal reflexes, muscle tone coordination. No cranial nerve deficit. Skin: Skin is warm and dry. No rash noted. Not diaphoretic. No erythema. No pallor. Psychiatric: Normal mood and affect. Behavior, judgment, thought content normal.  Lab Results  Component Value Date   WBC 10.6* 06/13/2013   HGB 14.9 06/13/2013   HCT 41.3 06/13/2013   MCV 90.4 06/13/2013   PLT 306 06/13/2013   Lab Results  Component Value Date   CREATININE 0.55 06/13/2013   BUN 3* 06/13/2013   NA 141 06/13/2013   K 3.7 06/13/2013   CL 105 06/13/2013   CO2 23 06/13/2013    Lab Results  Component Value Date   HGBA1C 5.5 12/09/2013   Lipid Panel     Component Value Date/Time   CHOL 166 12/09/2013 1027   TRIG 215* 12/09/2013 1027   HDL 43 12/09/2013 1027   CHOLHDL 3.9 12/09/2013 1027   VLDL 43* 12/09/2013 1027   LDLCALC 80 12/09/2013 1027       Assessment and plan:   1. Chest pain, unspecified chest pain type Patient has chest wall tenderness with palpation likely atypical pain. - EKG 12-Lead unchanged LVH likely secondary to  HTN  2. Essential hypertension, benign  Blood pressure is uncontrolled, she'll continue with amlodipine 10 mg, I have added lisinopril 5 mg also advised for DASH diet she'll come back in 2 weeks for BP check. -cloNIDine (CATAPRES)  tablet 0.1 mg; Take 1 tablet (0.1 mg total) by mouth once. - lisinopril (PRINIVIL,ZESTRIL) 5 MG tablet; Take 1 tablet (5 mg total) by mouth daily.  Dispense: 90 tablet; Refill: 1 - amLODipine (NORVASC) 10 MG tablet; Take 1 tablet (10 mg total) by mouth daily.  Dispense: 30 tablet; Refill: 3  3. Other and unspecified hyperlipidemia  continue with lovastatin will check lipid panel.  - lovastatin (MEVACOR) 20 MG tablet; Take 1 tablet (20 mg total) by mouth every morning.  Dispense: 30 tablet; Refill: 3  4. Smoking  advised patient to quit smoking.   5. Rectal bleeding  - Ambulatory referral to Gastroenterology  6. Blurry vision  - Ambulatory referral to Ophthalmology  7. Migraine, unspecified, without mention of intractable migraine without mention of status migrainosus  - Ambulatory referral to Neurology - butalbital-acetaminophen-caffeine (FIORICET) 50-325-40 MG per tablet; Take 1 tablet by mouth 2 (two) times daily as needed for headache.  Dispense: 14 tablet; Refill: 0  8. Screening  ordered baseline blood work.  - CBC with Differential; Future -  COMPLETE METABOLIC PANEL WITH GFR; Future - Lipid panel; Future - TSH; Future - Vit D  25 hydroxy (rtn osteoporosis monitoring); Future        Health Maintenance -Colonoscopy: referred to GI   -Mammogram: up-to-date    Return in about 3 months (around 04/25/2014) for hypertension, BP check in 2 weeks/Nurse Visit, hyperipidemia.      Lorayne Marek, MD

## 2014-01-23 NOTE — Patient Instructions (Signed)
DASH Eating Plan  DASH stands for "Dietary Approaches to Stop Hypertension." The DASH eating plan is a healthy eating plan that has been shown to reduce high blood pressure (hypertension). Additional health benefits may include reducing the risk of type 2 diabetes mellitus, heart disease, and stroke. The DASH eating plan may also help with weight loss.  WHAT DO I NEED TO KNOW ABOUT THE DASH EATING PLAN?  For the DASH eating plan, you will follow these general guidelines:  · Choose foods with a percent daily value for sodium of less than 5% (as listed on the food label).  · Use salt-free seasonings or herbs instead of table salt or sea salt.  · Check with your health care provider or pharmacist before using salt substitutes.  · Eat lower-sodium products, often labeled as "lower sodium" or "no salt added."  · Eat fresh foods.  · Eat more vegetables, fruits, and low-fat dairy products.  · Choose whole grains. Look for the word "whole" as the first word in the ingredient list.  · Choose fish and skinless chicken or turkey more often than red meat. Limit fish, poultry, and meat to 6 oz (170 g) each day.  · Limit sweets, desserts, sugars, and sugary drinks.  · Choose heart-healthy fats.  · Limit cheese to 1 oz (28 g) per day.  · Eat more home-cooked food and less restaurant, buffet, and fast food.  · Limit fried foods.  · Cook foods using methods other than frying.  · Limit canned vegetables. If you do use them, rinse them well to decrease the sodium.  · When eating at a restaurant, ask that your food be prepared with less salt, or no salt if possible.  WHAT FOODS CAN I EAT?  Seek help from a dietitian for individual calorie needs.  Grains  Whole grain or whole wheat bread. Brown rice. Whole grain or whole wheat pasta. Quinoa, bulgur, and whole grain cereals. Low-sodium cereals. Corn or whole wheat flour tortillas. Whole grain cornbread. Whole grain crackers. Low-sodium crackers.  Vegetables  Fresh or frozen vegetables  (raw, steamed, roasted, or grilled). Low-sodium or reduced-sodium tomato and vegetable juices. Low-sodium or reduced-sodium tomato sauce and paste. Low-sodium or reduced-sodium canned vegetables.   Fruits  All fresh, canned (in natural juice), or frozen fruits.  Meat and Other Protein Products  Ground beef (85% or leaner), grass-fed beef, or beef trimmed of fat. Skinless chicken or turkey. Ground chicken or turkey. Pork trimmed of fat. All fish and seafood. Eggs. Dried beans, peas, or lentils. Unsalted nuts and seeds. Unsalted canned beans.  Dairy  Low-fat dairy products, such as skim or 1% milk, 2% or reduced-fat cheeses, low-fat ricotta or cottage cheese, or plain low-fat yogurt. Low-sodium or reduced-sodium cheeses.  Fats and Oils  Tub margarines without trans fats. Light or reduced-fat mayonnaise and salad dressings (reduced sodium). Avocado. Safflower, olive, or canola oils. Natural peanut or almond butter.  Other  Unsalted popcorn and pretzels.  The items listed above may not be a complete list of recommended foods or beverages. Contact your dietitian for more options.  WHAT FOODS ARE NOT RECOMMENDED?  Grains  White bread. White pasta. White rice. Refined cornbread. Bagels and croissants. Crackers that contain trans fat.  Vegetables  Creamed or fried vegetables. Vegetables in a cheese sauce. Regular canned vegetables. Regular canned tomato sauce and paste. Regular tomato and vegetable juices.  Fruits  Dried fruits. Canned fruit in light or heavy syrup. Fruit juice.  Meat and Other Protein   Products  Fatty cuts of meat. Ribs, chicken wings, bacon, sausage, bologna, salami, chitterlings, fatback, hot dogs, bratwurst, and packaged luncheon meats. Salted nuts and seeds. Canned beans with salt.  Dairy  Whole or 2% milk, cream, half-and-half, and cream cheese. Whole-fat or sweetened yogurt. Full-fat cheeses or blue cheese. Nondairy creamers and whipped toppings. Processed cheese, cheese spreads, or cheese  curds.  Condiments  Onion and garlic salt, seasoned salt, table salt, and sea salt. Canned and packaged gravies. Worcestershire sauce. Tartar sauce. Barbecue sauce. Teriyaki sauce. Soy sauce, including reduced sodium. Steak sauce. Fish sauce. Oyster sauce. Cocktail sauce. Horseradish. Ketchup and mustard. Meat flavorings and tenderizers. Bouillon cubes. Hot sauce. Tabasco sauce. Marinades. Taco seasonings. Relishes.  Fats and Oils  Butter, stick margarine, lard, shortening, ghee, and bacon fat. Coconut, palm kernel, or palm oils. Regular salad dressings.  Other  Pickles and olives. Salted popcorn and pretzels.  The items listed above may not be a complete list of foods and beverages to avoid. Contact your dietitian for more information.  WHERE CAN I FIND MORE INFORMATION?  National Heart, Lung, and Blood Institute: www.nhlbi.nih.gov/health/health-topics/topics/dash/  Document Released: 06/19/2011 Document Revised: 07/05/2013 Document Reviewed: 05/04/2013  ExitCare® Patient Information ©2015 [employer]. This information is not intended to replace advice given to you by your health care provider. Make sure you discuss any questions you have with your health care provider.

## 2014-01-23 NOTE — Progress Notes (Signed)
Patient here to establish care Complains of rectal bleeding Recent chest pains when she lays a certain way [place] to the right side of her neck

## 2014-01-24 ENCOUNTER — Telehealth: Payer: Self-pay | Admitting: Internal Medicine

## 2014-01-24 NOTE — Telephone Encounter (Signed)
Pt. Was seen by provider on 7/13 and has questions regarding medication prescribed, please call patient.

## 2014-01-24 NOTE — Telephone Encounter (Signed)
Pt was seen in the office on 01/23/2014. Pt has question regarding meds presrcibed

## 2014-01-25 ENCOUNTER — Telehealth: Payer: Self-pay

## 2014-01-25 NOTE — Telephone Encounter (Signed)
Informed patient that pharmacy has a converting factor they use to change her medicine from lovastatin to simvastatin. Reassured patient that it was the proper dose and stated that I was sorry for th e confusion. Informed patient on referrals available with orange card.Patient stated that understood but questioned how was going to get referrals. I informed that when she comes in for Health Coaching that we could look into the possibilities.  Plan: Patient will call be back with time for health coaching. Will help navigate the system for assistance with referrals.Patient will call if has any other problems.

## 2014-01-25 NOTE — Telephone Encounter (Signed)
Returned patient phone call Patient not available Left message on voice mail to return our call 

## 2014-01-25 NOTE — Telephone Encounter (Signed)
Patient called upset and concerned about medication and referrals. Patient stated that had been calling [facility] since left the office on Monday. Per patient has left messages but has not had any calls returned. Patient stated that had a new medicine that did not know about and was scared to take. She also said that had another medicine that they changed and was not told about.The medicine in question was simvastatin 10 mg. The patient stated that she been taking Lovastatin 20 mg everyday. Patient stated that knew blood pressure was high in office because they gave her a pill. I explained about sometimes need more than one medicine to control blood pressure. Explained how lisinopril works and some side effects. I told patient that I would see what I could find out.  PLAN: Call pharmacy to check about cholesterol medicine. Call [facility] to see what kind of referrals are available with orange card. Call Patient back. Schedule a coach health appointment with patient.

## 2014-01-26 NOTE — Telephone Encounter (Signed)
Pt returning nurse's call, please f/u with pt.   °

## 2014-01-27 ENCOUNTER — Ambulatory Visit: Payer: Self-pay

## 2014-01-27 VITALS — BP 144/90

## 2014-01-27 DIAGNOSIS — Z789 Other specified health status: Secondary | ICD-10-CM

## 2014-01-27 NOTE — Patient Instructions (Signed)
Will work on coping mechanisms to quit smoking. Will get quit date and change brand and decrease smoking. Will follow behavior modifications for risk factors of HTN. Will follow and review [facility] program.

## 2014-01-27 NOTE — Progress Notes (Signed)
Patient returns today for Health Coaching regarding Hypertension, Activity, nutrition and smoking cessation.  HYPERTENSION: Per patient she went to [facility] on July 13. Per patient they added Lisinopril 5 mg every day for her  BP. Rechecked BP at this time was 144/90. Discussed hypertension: cause, effects, treatment, healthy weight, eating right and medication. Went over and gave handout concerning ten ways to decrease salt in your life.  NUTRITION: Went over [employer].  ACTIVITY: Discussed walking more.  SMOKING CESSATION: Patient voices really want to quit. Starting one on one QuitStart because of lack of transportation. Received  Book, cigarette and CD. Discussed philosophy of program and how it works.  PLAN: Review [facility] and follow information at home. Will call next week to follow up on how patient is doing.Will do further health coaching as needed. Will quit smoking. Will get try and work on getting pain med for headache.

## 2014-02-06 ENCOUNTER — Ambulatory Visit: Payer: Self-pay | Attending: Internal Medicine | Admitting: *Deleted

## 2014-02-06 DIAGNOSIS — G43909 Migraine, unspecified, not intractable, without status migrainosus: Secondary | ICD-10-CM

## 2014-02-06 MED ORDER — BUTALBITAL-APAP-CAFFEINE 50-325-40 MG PO TABS: 1.0000 | ORAL_TABLET | Freq: Two times a day (BID) | ORAL | Status: DC | PRN

## 2014-02-06 NOTE — Progress Notes (Signed)
   Subjective:    Patient ID: Kristina Mayer, female    DOB: [birth year], 53 y.o.   MRN: [id number]  HPI    Review of Systems     Objective:   Physical Exam        Assessment & Plan:  Patient presents for BP recheck. States at last visit she was upset prior to having BP taken and thinks that is why her BP was elevated. States she has been taking lisinopril since last visit in addition to amlodipine. States she was given simvastatin 10 mg instead of lovastatin 20 mg by pharmacy and has been taking both meds. Discussed that only simvastatin should be taken as this is a conversion given to her by the pharmacy. Patient states understanding. Patient also states that she has been taking butalbital 1 tab daily in the AM and hydrocodone prn for chronic daily headaches since stroke 3 years ago. Requesting refills on both meds.  Vital signs: BP 118/70 left arm P 80 bpm SPO2 97%  Refill allowed by provider for butalbital 14 tabs no refill. Patient understands that unable to refill hydrocodone at this time. Patient understands to make appt with neurologist once she receives orange card.  Patient understands to continue both lisinopril and amlodipine per provider.  Patient stated that she has been under a lot of stress since her sister died 9 months ago. Appointment made with Education officer, [employer].

## 2014-02-14 ENCOUNTER — Telehealth: Payer: Self-pay

## 2014-02-14 NOTE — Telephone Encounter (Signed)
Asked patient to call back and gave my work hours. Will call patient back if does not return call

## 2014-02-15 ENCOUNTER — Ambulatory Visit: Payer: Self-pay | Attending: Internal Medicine

## 2014-02-15 ENCOUNTER — Telehealth (HOSPITAL_COMMUNITY): Payer: Self-pay

## 2014-02-15 NOTE — Telephone Encounter (Addendum)
Patient stated that had decreased the amount she was smoking significantly. Per patient is now baking or broiling food and cut down to quit using salt. Patient voiced concern about medication and not being able to get it. Patient stated that doctor told her that she needed to see a neurologist about headache.Patient explained to nurse at that visit why was on the medication. I explained that one of the medications in the fioricet was a sedative and controlled agent. Explained that when gets orange card she could get controlled medications at [facility] for $6.Patient stated that had all her stuff together and was meeting today for orange card.  Patient voiced concern about getting colonoscopy because of continued rectal bleeding. Patient stated that had gotten paper work from the [employer]. I explained that needed to get that paper work done and back to the [place] office so can get a colonoscopy. Patient voiced understanding and relief. I voiced approval of how well she was following through on working on quitting smoking, following through and eating. Patient stated that was feeling better. Patient stated that is using fake cigarette and CD to help quit.  Patient voiced a lot of anxiety. I reminded her to deep breathe.  PLAN: Have patient set stop date for smoking. Follow up for third [facility] [place] health coaching in three weeks per phone. Continue to help [facility] the healthcare system.

## 2014-02-16 ENCOUNTER — Telehealth: Payer: Self-pay

## 2014-02-16 NOTE — Telephone Encounter (Signed)
Left a message to return call and I was waiting for eligibility person to call me back.

## 2014-02-20 ENCOUNTER — Other Ambulatory Visit: Payer: Self-pay

## 2014-02-21 ENCOUNTER — Other Ambulatory Visit: Payer: Self-pay | Admitting: Internal Medicine

## 2014-02-21 DIAGNOSIS — G43909 Migraine, unspecified, not intractable, without status migrainosus: Secondary | ICD-10-CM

## 2014-02-21 MED ORDER — BUTALBITAL-APAP-CAFFEINE 50-325-40 MG PO TABS: 1.0000 | ORAL_TABLET | Freq: Two times a day (BID) | ORAL | Status: AC | PRN

## 2014-02-21 NOTE — Telephone Encounter (Signed)
Pt needs medication refill for fioricet 325 mg Please f/u with P.

## 2014-02-23 ENCOUNTER — Telehealth: Payer: Self-pay | Admitting: Emergency Medicine

## 2014-02-23 NOTE — Telephone Encounter (Signed)
Spoke with pt regarding medication refill Fiorocet for migraine headaches. Pt states quantity is not enough. Informed pt she needs to take medication as needed and call Neurology referral for appt for further treatment. Pt requesting Pulmonology referral as well. Informed she will need to schedule OV for further evaluation

## 2014-03-13 ENCOUNTER — Telehealth: Payer: Self-pay

## 2014-03-13 NOTE — Telephone Encounter (Signed)
Called patient to follow up on how was doing with [facility] [place] and progress with other medical issues. Patient stated that was having her colonoscopy tomorrow (9/1) at the [facility] office. Patient stated that was still eating better and cut back a lot on smoking. She has increased her activity. Patient voiced concern again about not having her migraine medication. Patient stated that has Neurology appointment and has called [facility] back.  Patient stated that she had received a bill for EKG they did in [facility] office in July.  PLAN: Bring bill in so can make a copy. Follow up for reassement and colonoscopy results.

## 2014-03-14 ENCOUNTER — Encounter: Payer: Self-pay | Admitting: Gastroenterology

## 2014-03-14 ENCOUNTER — Ambulatory Visit: Payer: Self-pay | Admitting: Gastroenterology

## 2014-03-14 VITALS — BP 100/64 | HR 68 | Ht 63.5 in | Wt 112.4 lb

## 2014-03-14 DIAGNOSIS — Z86010 Personal history of colonic polyps: Secondary | ICD-10-CM

## 2014-03-14 DIAGNOSIS — R197 Diarrhea, unspecified: Secondary | ICD-10-CM

## 2014-03-14 DIAGNOSIS — K921 Melena: Secondary | ICD-10-CM

## 2014-03-14 MED ORDER — PEG-KCL-NACL-NASULF-NA ASC-C 100 G PO SOLR: 1.0000 | Freq: Once | ORAL | Status: DC

## 2014-03-14 NOTE — Progress Notes (Signed)
    History of Present Illness: This is a 53 year old female who relates frequent bright red blood per rectum with bowel movements for about the past year. Over the past few months she has noted urgent, loose stools occurring in the mornings and occasionally in the evenings. 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 She's been using hemorrhoidal suppositories and pads intermittently for the last few months without improvement in her rectal bleeding.   Current Medications, Allergies, Past Medical History, Past Surgical History, Family History and Social History were reviewed in [employer] record.  Physical Exam: General: Well developed , well nourished, no acute distress Head: Normocephalic and atraumatic Eyes:  sclerae anicteric, EOMI Ears: Normal auditory acuity Mouth: No deformity or lesions Lungs: Clear throughout to auscultation Heart: Regular rate and rhythm; no murmurs, rubs or bruits Abdomen: Soft, non tender and non distended. No masses, hepatosplenomegaly or hernias noted. Normal Bowel sounds Rectal: Deferred to colonoscopy Musculoskeletal: Symmetrical with no gross deformities  Pulses:  Normal pulses noted Extremities: No clubbing, cyanosis, edema or deformities noted Neurological: Alert oriented x 4, grossly nonfocal Psychological:  Alert and cooperative. Normal mood and affect  Assessment and Recommendations:  1. Hematochezia likely secondary to known internal hemorrhoids. Personal history of a tubulovillous adenomatous colon polyp. Loose bowel movements. Continue Preparation H suppositories daily for 2 weeks and then as needed. Standard rectal care instructions. Schedule colonoscopy. The risks, benefits, and alternatives to colonoscopy with possible biopsy and possible polypectomy were discussed with the patient and they consent to proceed. Consider surgical  referral for hemorrhoidal management pending findings at colonoscopy.

## 2014-03-14 NOTE — Patient Instructions (Signed)
You have been scheduled for a colonoscopy. Please follow written instructions given to you at your visit today.  Please pick up your prep kit at the pharmacy within the next 1-3 days. If you use inhalers (even only as needed), please bring them with you on the day of your procedure. Your physician has requested that you go to www.startemmi.com and enter the access code given to you at your visit today. This web site gives a general overview about your procedure. However, you should still follow specific instructions given to you by our office regarding your preparation for the procedure.  Thank you for choosing me and [facility].  Pricilla Riffle. Dagoberto Ligas., MD., Mar[facility] Regal

## 2014-03-27 ENCOUNTER — Telehealth: Payer: Self-pay

## 2014-03-27 ENCOUNTER — Ambulatory Visit: Payer: Self-pay | Admitting: Internal Medicine

## 2014-03-27 NOTE — Telephone Encounter (Signed)
Called to let patient know what Genia Hotter, patient accounts, had responded to e-mail that Etheleen Sia had sent about EKG bill during [place] initial visit to [facility] Patient stated that had an appointment for Colonoscopy on 04/05/14. Per patient stated that went back to [facility] to get migraine medication.

## 2014-03-29 ENCOUNTER — Encounter: Payer: Self-pay | Admitting: Gastroenterology

## 2014-04-05 ENCOUNTER — Ambulatory Visit (AMBULATORY_SURGERY_CENTER): Payer: Self-pay | Admitting: Gastroenterology

## 2014-04-05 ENCOUNTER — Encounter: Payer: Self-pay | Admitting: Gastroenterology

## 2014-04-05 VITALS — BP 132/66 | HR 60 | Temp 97.6°F | Resp 15 | Ht 63.5 in | Wt 112.0 lb

## 2014-04-05 DIAGNOSIS — Z860100 Personal history of colon polyps, unspecified: Secondary | ICD-10-CM

## 2014-04-05 DIAGNOSIS — K921 Melena: Secondary | ICD-10-CM

## 2014-04-05 MED ORDER — SODIUM CHLORIDE 0.9 % IV SOLN: 500.0000 mL | INTRAVENOUS | Status: DC

## 2014-04-05 NOTE — Patient Instructions (Addendum)
YOU HAD AN ENDOSCOPIC PROCEDURE TODAY AT THE [facility]: Refer to the procedure report that was given to you for any specific questions about what was found during the examination.  If the procedure report does not answer your questions, please call your gastroenterologist to clarify.  If you requested that your care partner not be given the details of your procedure findings, then the procedure report has been included in a sealed envelope for you to review at your convenience later.  YOU SHOULD EXPECT: Some feelings of bloating in the abdomen. Passage of more gas than usual.  Walking can help get rid of the air that was put into your GI tract during the procedure and reduce the bloating. If you had a lower endoscopy (such as a colonoscopy or flexible sigmoidoscopy) you may notice spotting of blood in your stool or on the toilet paper. If you underwent a bowel prep for your procedure, then you may not have a normal bowel movement for a few days.  DIET: Your first meal following the procedure should be a light meal and then it is ok to progress to your normal diet.  A half-sandwich or bowl of soup is an example of a good first meal.  Heavy or fried foods are harder to digest and may make you feel nauseous or bloated.  Likewise meals heavy in dairy and vegetables can cause extra gas to form and this can also increase the bloating.  Drink plenty of fluids but you should avoid alcoholic beverages for 24 hours.  ACTIVITY: Your care partner should take you home directly after the procedure.  You should plan to take it easy, moving slowly for the rest of the day.  You can resume normal activity the day after the procedure however you should NOT DRIVE or use heavy machinery for 24 hours (because of the sedation medicines used during the test).    SYMPTOMS TO REPORT IMMEDIATELY: A gastroenterologist can be reached at any hour.  During normal business hours, 8:30 AM to 5:00 PM Monday through Friday,  call [phone].  After hours and on weekends, please call the GI answering service at [phone] who will take a message and have the physician on call contact you.   Following lower endoscopy (colonoscopy or flexible sigmoidoscopy):  Excessive amounts of blood in the stool  Significant tenderness or worsening of abdominal pains  Swelling of the abdomen that is new, acute  Fever of 100F or higher    FOLLOW UP: If any biopsies were taken you will be contacted by phone or by letter within the next 1-3 weeks.  Call your gastroenterologist if you have not heard about the biopsies in 3 weeks.  Our staff will call the home number listed on your records the next business day following your procedure to check on you and address any questions or concerns that you may have at that time regarding the information given to you following your procedure. This is a courtesy call and so if there is no answer at the home number and we have not heard from you through the emergency physician on call, we will assume that you have returned to your regular daily activities without incident.  SIGNATURES/CONFIDENTIALITY: You and/or your care partner have signed paperwork which will be entered into your electronic medical record.  These signatures attest to the fact that that the information above on your After Visit Summary has been reviewed and is understood.  Full responsibility of the confidentiality   of this discharge information lies with you and/or your care-partner.   Information on diverticulosis ,hemorrhoisds,and high fiber diet given to you today  Preparation -H cream as needed for hemorrhoids (over the counter)

## 2014-04-05 NOTE — Op Note (Signed)
[facility]  [address] [place], [id number]   COLONOSCOPY PROCEDURE REPORT  PATIENT: Kristina Mayer, Kristina Mayer  MR#: [id number] BIRTHDATE: 1960-09-04 , 76  yrs. old GENDER: female ENDOSCOPIST: Ladene Artist, MD, [facility] PROCEDURE DATE:  04/05/2014 PROCEDURE:   Colonoscopy, diagnostic First Screening Colonoscopy - Avg.  risk and is 50 yrs.  old or older - No.  Prior Negative Screening - Now for repeat screening. N/A  History of Adenoma - Now for follow-up colonoscopy & has been > or = to 3 yrs.  No.  It has been less than 3 yrs since last colonoscopy.  Medical reason.  Polyps Removed Today? No.  Recommend repeat exam, <10 yrs? Polyps Removed Today? No.  Recommend repeat exam, <10 yrs? Yes.  High risk (family or personal hx). ASA CLASS:   Class II INDICATIONS:hematochezia and surveillance colonoscopy based on a history of adenomatous colonic polyp(s). MEDICATIONS: Monitored anesthesia care and Propofol 270 mg DESCRIPTION OF PROCEDURE:   After the risks benefits and alternatives of the procedure were thoroughly explained, informed consent was obtained.  The digital rectal exam revealed no abnormalities of the rectum.   The [id number]  endoscope was introduced through the anus and advanced to the cecum, which was identified by both the appendix and ileocecal valve. No adverse events experienced.   The quality of the prep was excellent, using MoviPrep  The instrument was then slowly withdrawn as the colon was fully examined.  COLON FINDINGS: There was mild diverticulosis noted in the sigmoid colon.   The colon mucosa was otherwise normal.  Retroflexed views revealed internal Grade I hemorrhoids. The time to cecum=3 minutes 18 seconds.  Withdrawal time=8 minutes 28 seconds.  The scope was withdrawn and the procedure completed.  COMPLICATIONS: There were no complications.  ENDOSCOPIC IMPRESSION: 1.   Mild diverticulosis in the sigmoid colon 2.   Grade I internal  hemorrhoids  RECOMMENDATIONS: 1.  High fiber diet with liberal fluid intake. 2.  OTC Prep H supp bid as needed and rectal care instructions. If hemorrhoids remain symptomatic surgical referral. 3.  Repeat Colonoscopy in 5 years.  eSigned:  Ladene Artist, MD, [facility] 04/05/2014 2:23 PM

## 2014-04-05 NOTE — Progress Notes (Signed)
Report to PACU, RN, vss, BBS= Clear.  

## 2014-04-06 ENCOUNTER — Telehealth: Payer: Self-pay | Admitting: *Deleted

## 2014-04-06 NOTE — Telephone Encounter (Signed)
  Follow up Call-  Call back number 04/05/2014 08/14/2011  Post procedure Call Back phone  # [phone]  Permission to leave phone message Yes Yes     Patient questions:  Do you have a fever, pain , or abdominal swelling? No. Pain Score  0 *  Have you tolerated food without any problems? Yes.    Have you been able to return to your normal activities? Yes.    Do you have any questions about your discharge instructions: Diet   No. Medications  No. Follow up visit  No.  Do you have questions or concerns about your Care? No.  Actions: * If pain score is 4 or above: No action needed, pain <4.

## 2014-05-15 ENCOUNTER — Encounter: Payer: Self-pay | Admitting: Gastroenterology

## 2014-07-11 ENCOUNTER — Telehealth: Payer: Self-pay

## 2014-07-11 NOTE — Telephone Encounter (Signed)
Called patient to do follow up assessment for [facility]. Patient discussed that has a Doctor's appointment on July 24, 2014. Discussed options of service for possible treatment and diagnosis of eye issue.

## 2015-05-16 ENCOUNTER — Other Ambulatory Visit: Payer: Self-pay | Admitting: Obstetrics and Gynecology

## 2015-05-16 DIAGNOSIS — Z1231 Encounter for screening mammogram for malignant neoplasm of breast: Secondary | ICD-10-CM

## 2015-06-05 ENCOUNTER — Ambulatory Visit (HOSPITAL_COMMUNITY): Payer: Self-pay

## 2015-06-28 ENCOUNTER — Ambulatory Visit (HOSPITAL_COMMUNITY): Payer: Self-pay

## 2015-07-27 ENCOUNTER — Other Ambulatory Visit: Payer: Self-pay | Admitting: Family Medicine

## 2015-08-21 ENCOUNTER — Telehealth (HOSPITAL_COMMUNITY): Payer: Self-pay | Admitting: *Deleted

## 2015-08-21 NOTE — Telephone Encounter (Signed)
Telephoned patient at home # and left message to return call to [facility] 

## 2017-02-03 ENCOUNTER — Emergency Department (HOSPITAL_COMMUNITY)
Admission: EM | Admit: 2017-02-03 | Discharge: 2017-02-03 | Disposition: A | Discharge status: Home / Self Care | Payer: Self-pay | Attending: Emergency Medicine | Admitting: Emergency Medicine

## 2017-02-03 ENCOUNTER — Encounter (HOSPITAL_COMMUNITY): Payer: Self-pay | Admitting: Emergency Medicine

## 2017-02-03 DIAGNOSIS — Z5321 Procedure and treatment not carried out due to patient leaving prior to being seen by health care provider: Secondary | ICD-10-CM | POA: Insufficient documentation

## 2017-02-03 DIAGNOSIS — R42 Dizziness and giddiness: Secondary | ICD-10-CM | POA: Insufficient documentation

## 2017-02-03 LAB — URINALYSIS, ROUTINE W REFLEX MICROSCOPIC
BACTERIA UA: NONE SEEN
Bilirubin Urine: NEGATIVE
Glucose, UA: NEGATIVE mg/dL
Ketones, ur: NEGATIVE mg/dL
Nitrite: NEGATIVE
PROTEIN: 100 mg/dL — AB
SPECIFIC GRAVITY, URINE: 1.014 (ref 1.005–1.030)
pH: 5 (ref 5.0–8.0)

## 2017-02-03 NOTE — ED Triage Notes (Signed)
Vaginal bleeding, hysterectomy in 1984, pt buried her mother yesterday, blood has been out of control. She is under a lot of stress. Some dizziness today as well.

## 2018-04-16 ENCOUNTER — Emergency Department (HOSPITAL_COMMUNITY)
Admission: EM | Admit: 2018-04-16 | Discharge: 2018-04-16 | Disposition: A | Discharge status: Home / Self Care | Payer: Medicaid Other | Attending: Emergency Medicine | Admitting: Emergency Medicine

## 2018-04-16 ENCOUNTER — Other Ambulatory Visit: Payer: Self-pay

## 2018-04-16 ENCOUNTER — Encounter (HOSPITAL_COMMUNITY): Payer: Self-pay | Admitting: Emergency Medicine

## 2018-04-16 ENCOUNTER — Emergency Department (HOSPITAL_COMMUNITY): Payer: Medicaid Other

## 2018-04-16 DIAGNOSIS — Z7902 Long term (current) use of antithrombotics/antiplatelets: Secondary | ICD-10-CM | POA: Diagnosis not present

## 2018-04-16 DIAGNOSIS — Z79899 Other long term (current) drug therapy: Secondary | ICD-10-CM | POA: Insufficient documentation

## 2018-04-16 DIAGNOSIS — I10 Essential (primary) hypertension: Secondary | ICD-10-CM | POA: Diagnosis not present

## 2018-04-16 DIAGNOSIS — R002 Palpitations: Secondary | ICD-10-CM | POA: Insufficient documentation

## 2018-04-16 DIAGNOSIS — Z87891 Personal history of nicotine dependence: Secondary | ICD-10-CM | POA: Insufficient documentation

## 2018-04-16 DIAGNOSIS — E785 Hyperlipidemia, unspecified: Secondary | ICD-10-CM | POA: Insufficient documentation

## 2018-04-16 DIAGNOSIS — Z8673 Personal history of transient ischemic attack (TIA), and cerebral infarction without residual deficits: Secondary | ICD-10-CM | POA: Diagnosis not present

## 2018-04-16 LAB — BASIC METABOLIC PANEL
ANION GAP: 12 (ref 5–15)
BUN: 7 mg/dL (ref 6–20)
CHLORIDE: 106 mmol/L (ref 98–111)
CO2: 17 mmol/L — ABNORMAL LOW (ref 22–32)
Calcium: 8.9 mg/dL (ref 8.9–10.3)
Creatinine, Ser: 0.98 mg/dL (ref 0.44–1.00)
GFR calc Af Amer: >60 mL/min (ref >60)
GFR calc non Af Amer: >60 mL/min (ref >60)
Glucose, Bld: 170 mg/dL — ABNORMAL HIGH (ref 70–99)
POTASSIUM: 3.2 mmol/L — AB (ref 3.5–5.1)
Sodium: 135 mmol/L (ref 135–145)

## 2018-04-16 LAB — I-STAT TROPONIN, ED
Troponin i, poc: 0 ng/mL (ref 0.00–0.08)
Troponin i, poc: 0 ng/mL (ref 0.00–0.08)

## 2018-04-16 LAB — DIFFERENTIAL
Basophils Absolute: 0 10*3/uL (ref 0.0–0.1)
Basophils Relative: 0 %
Eosinophils Absolute: 0.1 10*3/uL (ref 0.0–0.7)
Eosinophils Relative: 1 %
Lymphocytes Relative: 31 %
Lymphs Abs: 4.1 10*3/uL — ABNORMAL HIGH (ref 0.7–4.0)
Monocytes Absolute: 1 10*3/uL (ref 0.1–1.0)
Monocytes Relative: 8 %
Neutro Abs: 8.1 10*3/uL — ABNORMAL HIGH (ref 1.7–7.7)
Neutrophils Relative %: 60 %

## 2018-04-16 LAB — CBC
HEMATOCRIT: 42.7 % (ref 36.0–46.0)
HEMOGLOBIN: 14.8 g/dL (ref 12.0–15.0)
MCH: 31.6 pg (ref 26.0–34.0)
MCHC: 34.7 g/dL (ref 30.0–36.0)
MCV: 91.2 fL (ref 78.0–100.0)
Platelets: 332 10*3/uL (ref 150–400)
RBC: 4.68 MIL/uL (ref 3.87–5.11)
RDW: 13.2 % (ref 11.5–15.5)
WBC: 13.7 10*3/uL — AB (ref 4.0–10.5)

## 2018-04-16 LAB — I-STAT BETA HCG BLOOD, ED (MC, WL, AP ONLY): I-stat hCG, quantitative: 9 m[IU]/mL — ABNORMAL HIGH

## 2018-04-16 LAB — HEPATIC FUNCTION PANEL
ALBUMIN: 4.4 g/dL (ref 3.5–5.0)
ALT: 8 U/L (ref 0–44)
AST: 17 U/L (ref 15–41)
Alkaline Phosphatase: 120 U/L (ref 38–126)
BILIRUBIN TOTAL: 0.5 mg/dL (ref 0.3–1.2)
Bilirubin, Direct: <0.1 mg/dL (ref 0.0–0.2)
TOTAL PROTEIN: 7.5 g/dL (ref 6.5–8.1)

## 2018-04-16 MED ORDER — SODIUM CHLORIDE 0.9 % IV BOLUS (SEPSIS)
1000.0000 mL | Freq: Once | INTRAVENOUS | Status: AC
  Administered 2018-04-16: 1000 mL via INTRAVENOUS

## 2018-04-16 MED ORDER — HYDROCODONE-ACETAMINOPHEN 5-325 MG PO TABS
1.0000 | ORAL_TABLET | Freq: Once | ORAL | Status: AC
  Administered 2018-04-16: 1 via ORAL
  Filled 2018-04-16: qty 1

## 2018-04-16 MED ORDER — ONDANSETRON 4 MG PO TBDP
4.0000 mg | ORAL_TABLET | Freq: Once | ORAL | Status: AC
  Administered 2018-04-16: 4 mg via ORAL

## 2018-04-16 MED ORDER — METOPROLOL TARTRATE 25 MG PO TABS: 25.0000 mg | ORAL_TABLET | Freq: Every day | ORAL | 0 refills | Status: DC

## 2018-04-16 MED ORDER — ONDANSETRON 4 MG PO TBDP
ORAL_TABLET | ORAL | Status: AC
  Filled 2018-04-16: qty 1

## 2018-04-16 MED ORDER — METOPROLOL TARTRATE 5 MG/5ML IV SOLN
5.0000 mg | Freq: Once | INTRAVENOUS | Status: AC
  Administered 2018-04-16: 5 mg via INTRAVENOUS
  Filled 2018-04-16: qty 5

## 2018-04-16 MED ORDER — POTASSIUM CHLORIDE CRYS ER 20 MEQ PO TBCR
40.0000 meq | EXTENDED_RELEASE_TABLET | Freq: Once | ORAL | Status: AC
  Administered 2018-04-16: 40 meq via ORAL
  Filled 2018-04-16: qty 2

## 2018-04-16 MED ORDER — HYDROMORPHONE HCL 1 MG/ML IJ SOLN
0.5000 mg | Freq: Once | INTRAMUSCULAR | Status: AC
  Administered 2018-04-16: 0.5 mg via INTRAVENOUS
  Filled 2018-04-16: qty 1

## 2018-04-16 NOTE — Discharge Instructions (Addendum)
Follow-up with your family doctor.   Return to the emergency department if any problems

## 2018-04-16 NOTE — ED Triage Notes (Signed)
Pt c/o of left sided chest pain starting yesterday with numbness in both hands x 5 days.

## 2018-04-16 NOTE — ED Notes (Signed)
Pt transported to xray 

## 2018-04-16 NOTE — ED Notes (Signed)
EDP at bedside updating patient. 

## 2018-04-16 NOTE — ED Provider Notes (Signed)
[facility] EMERGENCY DEPARTMENT Provider Note   CSN: [id number] Arrival date & time: 04/16/18  0725     History   Chief Complaint Chief Complaint  Patient presents with  . Chest Pain    HPI Kristina Mayer is a 57 y.o. female.  Patient complains of some palpitations and chest pain.  The history is provided by the patient. No language interpreter was used.  Chest Pain   This is a new problem. The problem occurs constantly. The problem has not changed since onset.The pain is associated with movement. The pain is present in the substernal region. The pain is at a severity of 3/10. The pain is moderate. The quality of the pain is described as brief. The pain does not radiate. Associated symptoms include palpitations. Pertinent negatives include no abdominal pain, no back pain, no cough and no headaches.  Pertinent negatives for past medical history include no seizures.    Past Medical History:  Diagnosis Date  . Allergic rhinitis   . Allergy   . Blood transfusion   . Cervical cancer ([place])   . Chronic headaches   . Cyst    by left eye  . Hyperlipidemia   . Hypertension   . Stroke ([place]) 2012   . 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 Essential hypertension, benign 01/23/2014  . Other and unspecified hyperlipidemia 01/23/2014  . Smoking 01/23/2014  . Rectal bleeding 01/23/2014  . Blurry vision 01/23/2014  . Migraine, unspecified, without mention of intractable migraine without mention of status migrainosus 01/23/2014    Past Surgical History:  Procedure Laterality Date  . ABDOMINAL HYSTERECTOMY    . CARDIAC VALVE SURGERY    . COLONOSCOPY       OB History    Gravida  2   Para  2   Term  2   Preterm      AB      Living  2     SAB      TAB      Ectopic      Multiple      Live Births               Home Medications    Prior to Admission medications   Medication Sig Start Date End Date  Taking? Authorizing Provider  amitriptyline (ELAVIL) 25 MG tablet Take 25 mg by mouth at bedtime.   Yes [provider]  bismuth subsalicylate (PEPTO BISMOL) 262 MG/15ML suspension Take 15 mLs by mouth every 6 (six) hours as needed for diarrhea or loose stools.   Yes [provider]  butalbital-acetaminophen-caffeine (FIORICET) 50-325-40 MG per tablet Take 1 tablet by mouth 2 (two) times daily as needed for headache. 02/21/14  Yes Advani, Deepak, MD  clopidogrel (PLAVIX) 75 MG tablet Take 75 mg by mouth 2 (two) times daily.   Yes [provider]  lisinopril (PRINIVIL,ZESTRIL) 20 MG tablet Take 20 mg by mouth daily.   Yes [provider]  loratadine (CLARITIN) 10 MG tablet Take 1 tablet (10 mg total) by mouth daily. 01/23/14  Yes Advani, Vernon Prey, MD  lovastatin (MEVACOR) 20 MG tablet Take 1 tablet (20 mg total) by mouth every morning. 01/23/14  Yes Advani, Vernon Prey, MD  metroNIDAZOLE (FLAGYL) 500 MG tablet Take 500 mg by mouth 2 (two) times daily.   Yes [provider]  mirtazapine (REMERON) 15 MG tablet Take 15 mg by mouth daily.  04/02/18  Yes [provider]  OVER THE COUNTER MEDICATION Apply 1 application topically daily. Theraworx topical cream   Yes [provider]  potassium chloride (MICRO-K) 10 MEQ CR capsule Take 10 mEq by mouth daily.   Yes [provider]  amLODipine (NORVASC) 10 MG tablet Take 1 tablet (10 mg total) by mouth daily. 01/23/14   Lorayne Marek, MD  metoprolol tartrate (LOPRESSOR) 25 MG tablet Take 1 tablet (25 mg total) by mouth daily. 04/16/18   Milton Ferguson, MD    Family History Family History  Problem Relation Age of Onset  . Colon cancer Paternal Aunt   . Esophageal cancer Maternal Grandfather   . Cancer Mother        brain  . Stroke Mother   . Hypertension Mother   . Hypertension Sister   . Stroke Maternal Uncle   . Rectal cancer Neg Hx   . Stomach cancer Neg Hx     Social History Social  History   Tobacco Use  . Smoking status: Former Smoker    Packs/day: 1.00    Years: 40.00    Pack years: 40.00    Last attempt to quit: 09/06/2014    Years since quitting: 3.6  . Smokeless tobacco: Never Used  . Tobacco comment: Counseling sheet given to patient in exam room   Substance Use Topics  . Alcohol use: No  . Drug use: No     Allergies   Patient has no known allergies.   Review of Systems Review of Systems  Constitutional: Negative for appetite change and fatigue.  HENT: Negative for congestion, ear discharge and sinus pressure.   Eyes: Negative for discharge.  Respiratory: Negative for cough.   Cardiovascular: Positive for chest pain and palpitations.  Gastrointestinal: Negative for abdominal pain and diarrhea.  Genitourinary: Negative for frequency and hematuria.  Musculoskeletal: Negative for back pain.  Skin: Negative for rash.  Neurological: Negative for seizures and headaches.  Psychiatric/Behavioral: Negative for hallucinations.     Physical Exam Updated Vital Signs BP (!) 160/73   Pulse 60   Temp 98.2 F (36.8 C) (Oral)   Resp 13   Ht 5\' 3"  (1.6 m)   Wt 47.2 kg   SpO2 99%   BMI 18.42 kg/m   Physical Exam  Constitutional: She is oriented to person, place, and time. She appears well-developed.  HENT:  Head: Normocephalic.  Eyes: Conjunctivae and EOM are normal. No scleral icterus.  Neck: Neck supple. No thyromegaly present.  Cardiovascular: Exam reveals no gallop and no friction rub.  No murmur heard. Tachycardia regular rate  Pulmonary/Chest: No stridor. She has no wheezes. She has no rales. She exhibits no tenderness.  Abdominal: She exhibits no distension. There is no tenderness. There is no rebound.  Musculoskeletal: Normal range of motion. She exhibits no edema.  Lymphadenopathy:    She has no cervical adenopathy.  Neurological: She is oriented to person, place, and time. She exhibits normal muscle tone. Coordination normal.  Skin:  No rash noted. No erythema.  Psychiatric: She has a normal mood and affect. Her behavior is normal.     ED Treatments / Results  Labs (all labs ordered are listed, but only abnormal results are displayed) Labs Reviewed  BASIC METABOLIC PANEL - Abnormal; Notable for the following components:      Result Value   Potassium 3.2 (*)    CO2 17 (*)    Glucose, Bld 170 (*)    All other components within normal limits  CBC - Abnormal; Notable for the following components:   WBC 13.7 (*)    All other components within normal limits  DIFFERENTIAL - Abnormal; Notable for the following components:   Neutro Abs 8.1 (*)    Lymphs Abs 4.1 (*)    All other components within normal limits  I-STAT BETA HCG BLOOD, ED ([facility], [facility], [facility] ONLY) - Abnormal; Notable for the following components:   I-stat hCG, quantitative 9.0 (*)    All other components within normal limits  HEPATIC FUNCTION PANEL  I-STAT TROPONIN, ED  I-STAT TROPONIN, ED    EKG None  Radiology Dg Chest 2 View  Result Date: 04/16/2018 CLINICAL DATA:  Left side chest pain EXAM: CHEST - 2 VIEW COMPARISON:  12/07/2010 FINDINGS: Biapical scarring. Mild hyperinflation. Heart is normal size. No acute confluent airspace opacities or effusions. Slight compression deformity through the superior endplates of 2 lower thoracic vertebral bodies, possibly T10 and T11. Slight compression deformity in the midthoracic spine, likely at T4 and T7. These are new since 2012 but age indeterminate. IMPRESSION: Hyperinflation. Biapical scarring. No active cardiopulmonary disease. Slight compression deformities of several mid and lower thoracic vertebral bodies, age indeterminate. Electronically Signed   By: Rolm Baptise M.D.   On: 04/16/2018 08:10    Procedures Procedures (including critical care time)  Medications Ordered in ED Medications  sodium chloride 0.9 % bolus 1,000 mL (0 mLs Intravenous Stopped 04/16/18 0909)  metoprolol tartrate (LOPRESSOR) injection  5 mg (5 mg Intravenous Given 04/16/18 0804)  HYDROcodone-acetaminophen (NORCO/VICODIN) 5-325 MG per tablet 1 tablet (1 tablet Oral Given 04/16/18 0804)  HYDROmorphone (DILAUDID) injection 0.5 mg (0.5 mg Intravenous Given 04/16/18 0854)     Initial Impression / Assessment and Plan / ED Course  I have reviewed the triage vital signs and the nursing notes.  Pertinent labs & imaging results that were available during my care of the patient were reviewed by me and considered in my medical decision making (see chart for details).     Patient with palpitations and tachycardia.  Labs unremarkable.  Patient improved with low dose of Lopressor.  Patient will be placed on a very low dose of Lopressor and will follow-up with her PCP  Final Clinical Impressions(s) / ED Diagnoses   Final diagnoses:  Palpitations    ED Discharge Orders         Ordered    metoprolol tartrate (LOPRESSOR) 25 MG tablet  Daily     04/16/18 1346           Milton Ferguson, MD 04/16/18 1351

## 2018-09-13 ENCOUNTER — Other Ambulatory Visit: Payer: Self-pay | Admitting: Specialist

## 2018-09-13 DIAGNOSIS — Z1231 Encounter for screening mammogram for malignant neoplasm of breast: Secondary | ICD-10-CM

## 2018-10-08 ENCOUNTER — Ambulatory Visit: Payer: Self-pay

## 2019-01-06 ENCOUNTER — Telehealth: Payer: Self-pay | Admitting: Interventional Cardiology

## 2019-01-06 NOTE — Telephone Encounter (Signed)
Before calling pt, I contacted scheduling to be sure a referral for Dr. Tamala Julian was not in their cue or on a fax waiting to be processed. There was not and no one from scheduling contacted pt.  I called pt to clarify. She states she had an abnormal EKG at her PCP's office this week and they were sending over an urgent referral for Dr. 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   She has verbalized understanding and had no additional questions.

## 2019-01-06 NOTE — Telephone Encounter (Signed)
This is not an established pt and no referral in system.  Will route to triage to address.

## 2019-01-06 NOTE — Telephone Encounter (Signed)
New message:    Patient calling stating that some called her for a appt with Dr. Tamala Julian. Dr. Chinita Pester. I do not see a note or referral. Please call patient.

## 2019-01-07 ENCOUNTER — Telehealth: Payer: Self-pay

## 2019-01-07 NOTE — Telephone Encounter (Signed)
NOTES ON FILE FROM [facility] [phone], SENT REFERRAL TO SCHEDULING

## 2019-01-25 ENCOUNTER — Other Ambulatory Visit: Payer: Self-pay

## 2019-01-25 DIAGNOSIS — M79604 Pain in right leg: Secondary | ICD-10-CM

## 2019-01-31 ENCOUNTER — Telehealth (HOSPITAL_COMMUNITY): Payer: Self-pay

## 2019-01-31 NOTE — Telephone Encounter (Signed)
The above patient or their representative was contacted and gave the following answers to these questions:         Do you have any of the following symptoms?  no  Fever                    Cough                   Shortness of breath  Do  you have any of the following other symptoms? no   muscle pain         vomiting,        diarrhea        rash         weakness        red eye        abdominal pain         bruising          bruising or bleeding              joint pain           severe headache    Have you been in contact with someone who was or has been sick in the past 2 weeks?  no  Yes                 Unsure                         Unable to assess   Does the person that you were in contact with have any of the following symptoms?   Cough         shortness of breath           muscle pain         vomiting,            diarrhea            rash            weakness           fever            red eye           abdominal pain           bruising  or  bleeding                joint pain                severe headache               Have you  or someone you have been in contact with traveled internationally in th last month?   no      If yes, which countries?   Have you  or someone you have been in contact with traveled outside [place] in th last month?   no      If yes, which state and city?   COMMENTS OR ACTION PLAN FOR THIS PATIENT:          

## 2019-02-01 ENCOUNTER — Ambulatory Visit (HOSPITAL_COMMUNITY)
Admission: RE | Admit: 2019-02-01 | Discharge: 2019-02-01 | Disposition: A | Discharge status: Home / Self Care | Payer: Medicaid Other | Source: Ambulatory Visit | Attending: Vascular Surgery | Admitting: Vascular Surgery

## 2019-02-01 ENCOUNTER — Other Ambulatory Visit: Payer: Self-pay

## 2019-02-01 ENCOUNTER — Telehealth: Payer: Self-pay | Admitting: Interventional Cardiology

## 2019-02-01 ENCOUNTER — Ambulatory Visit (INDEPENDENT_AMBULATORY_CARE_PROVIDER_SITE_OTHER): Payer: Medicaid Other | Admitting: Vascular Surgery

## 2019-02-01 ENCOUNTER — Encounter: Payer: Self-pay | Admitting: Vascular Surgery

## 2019-02-01 DIAGNOSIS — I998 Other disorder of circulatory system: Secondary | ICD-10-CM

## 2019-02-01 DIAGNOSIS — M79605 Pain in left leg: Secondary | ICD-10-CM | POA: Diagnosis not present

## 2019-02-01 DIAGNOSIS — I70229 Atherosclerosis of native arteries of extremities with rest pain, unspecified extremity: Secondary | ICD-10-CM

## 2019-02-01 DIAGNOSIS — M79604 Pain in right leg: Secondary | ICD-10-CM | POA: Insufficient documentation

## 2019-02-01 NOTE — Progress Notes (Signed)
Patient name: Kristina Mayer MRN: [id number] DOB: 24-Feb-1961 Sex: female  REASON FOR CONSULT: Bilateral leg pain  HPI: Kristina Mayer is a 58 y.o. female, with history of hypertension, hyperlipidemia, previous stroke, previous CABG that presents for evaluation of bilateral leg pain.  She states her legs have been in bad shape for at least 3 years.  She describes a long history of claudication with burning in her calfs and thighs and feels this has been progressing since late last year.  Unfortunately her husband died and she could not get in to be evaluated.  She states now she is having pain almost all the time, equally in both letgs.  She has no tissue loss.  No motor or sensory deficits at this time.  Notices that the tips of all of her toes are blue all the time.  She is a heavy tobacco abuser and has smoked for 40 years.  States she does not have a cardiologist.  When asked how far she can walk she says really not any distance at all.  Past Medical History:  Diagnosis Date  . Allergic rhinitis   . Allergy   . Blood transfusion   . Cervical cancer ([place])   . Chronic headaches   . Cyst    by left eye  . Hyperlipidemia   . Hypertension   . Stroke ([place]) 2012   . 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 ABDOMINAL HYSTERECTOMY    . CARDIAC VALVE SURGERY    . COLONOSCOPY      Family History  Problem Relation Age of Onset  . Colon cancer Paternal Aunt   . Esophageal cancer Maternal Grandfather   . Cancer Mother        brain  . Stroke Mother   . Hypertension Mother   . Hypertension Sister   . Stroke Maternal Uncle   . Rectal cancer Neg Hx   . Stomach cancer Neg Hx     SOCIAL HISTORY: Social History   Socioeconomic History  . Marital status: Married    Spouse name: Not on file  . Number of children: 2  . Years of education: Not on file  . Highest education level: Not on file  Occupational History  . Occupation:  Unemployed   Social Needs  . Financial resource strain: Not on file  . Food insecurity    Worry: Not on file    Inability: Not on file  . Transportation needs    Medical: Not on file    Non-medical: Not on file  Tobacco Use  . Smoking status: Former Smoker    Packs/day: 1.00    Years: 40.00    Pack years: 40.00    Quit date: 09/06/2014    Years since quitting: 4.4  . Smokeless tobacco: Never Used  . Tobacco comment: Counseling sheet given to patient in exam room   Substance and Sexual Activity  . Alcohol use: No  . Drug use: No  . Sexual activity: Yes    Birth control/protection: Surgical  Lifestyle  . Physical activity    Days per week: Not on file    Minutes per session: Not on file  . Stress: Not on file  Relationships  . Social Herbalist on phone: Not on file    Gets together: Not on file    Attends religious service: Not on file    Active member of club or organization:  Not on file    Attends meetings of clubs or organizations: Not on file    Relationship status: Not on file  . Intimate partner violence    Fear of current or ex partner: Not on file    Emotionally abused: Not on file    Physically abused: Not on file    Forced sexual activity: Not on file  Other Topics Concern  . Not on file  Social History Narrative   Daily caffeine     No Known Allergies  Current Outpatient Medications  Medication Sig Dispense Refill  . amitriptyline (ELAVIL) 25 MG tablet Take 25 mg by mouth at bedtime.    . butalbital-acetaminophen-caffeine (FIORICET) 50-325-40 MG per tablet Take 1 tablet by mouth 2 (two) times daily as needed for headache. 14 tablet 0  . clopidogrel (PLAVIX) 75 MG tablet Take 75 mg by mouth 2 (two) times daily.    Marland Kitchen lisinopril (PRINIVIL,ZESTRIL) 20 MG tablet Take 20 mg by mouth daily.    Marland Kitchen loratadine (CLARITIN) 10 MG tablet Take 1 tablet (10 mg total) by mouth daily. 30 tablet 3  . lovastatin (MEVACOR) 20 MG tablet Take 1 tablet (20 mg total)  by mouth every morning. 30 tablet 3  . mirtazapine (REMERON) 15 MG tablet Take 15 mg by mouth daily.   6  . OVER THE COUNTER MEDICATION Apply 1 application topically daily. Theraworx topical cream    . potassium chloride (MICRO-K) 10 MEQ CR capsule Take 10 mEq by mouth daily.    Marland Kitchen amLODipine (NORVASC) 10 MG tablet Take 1 tablet (10 mg total) by mouth daily. (Patient not taking: Reported on 02/01/2019) 30 tablet 3  . bismuth subsalicylate (PEPTO BISMOL) 262 MG/15ML suspension Take 15 mLs by mouth every 6 (six) hours as needed for diarrhea or loose stools.    . metoprolol tartrate (LOPRESSOR) 25 MG tablet Take 1 tablet (25 mg total) by mouth daily. (Patient not taking: Reported on 02/01/2019) 30 tablet 0  . metroNIDAZOLE (FLAGYL) 500 MG tablet Take 500 mg by mouth 2 (two) times daily.     No current facility-administered medications for this visit.     REVIEW OF SYSTEMS:  [X]  denotes positive finding, [ ]  denotes negative finding Cardiac  Comments:  Chest pain or chest pressure:    Shortness of breath upon exertion:    Short of breath when lying flat:    Irregular heart rhythm:        Vascular    Pain in calf, thigh, or hip brought on by ambulation: x   Pain in feet at night that wakes you up from your sleep:  x   Blood clot in your veins:    Leg swelling:         Pulmonary    Oxygen at home:    Productive cough:     Wheezing:         Neurologic    Sudden weakness in arms or legs:     Sudden numbness in arms or legs:     Sudden onset of difficulty speaking or slurred speech:    Temporary loss of vision in one eye:     Problems with dizziness:         Gastrointestinal    Blood in stool:     Vomited blood:         Genitourinary    Burning when urinating:     Blood in urine:        Psychiatric  Major depression:         Hematologic    Bleeding problems:    Problems with blood clotting too easily:        Skin    Rashes or ulcers:        Constitutional    Fever or  chills:      PHYSICAL EXAM: Vitals:   02/01/19 1202  BP: (!) 153/84  Pulse: 78  Resp: 14  Temp: (!) 97.3 F (36.3 C)  TempSrc: Temporal  SpO2: 98%  Weight: 90 lb 3.2 oz (40.9 kg)  Height: 5\' 5"  (1.651 m)    GENERAL: The patient is a well-nourished female, in no acute distress. The vital signs are documented above. CARDIAC: There is a regular rate and rhythm.  VASCULAR:  1+ radial pulses palpable bilaterally No palpable femoral pulses bilaterally No palpable popliteal pulses No palpable pedal pulses bilaterally PULMONARY: There is good air exchange bilaterally without wheezing or rales. ABDOMEN: Soft and non-tender with normal pitched bowel sounds.  MUSCULOSKELETAL: There are no major deformities or cyanosis. NEUROLOGIC: No focal weakness or paresthesias are detected. SKIN: There are no ulcers or rashes noted. PSYCHIATRIC: The patient has a normal affect.  DATA:   ABI: 0.34 on the right with a monophasic waveform and 0.39 on the left with a monophasic waveform  Assessment/Plan:  58 year old female that presents with severe PAD and likely critical limb ischemia of the bilateral lower extremities with rest pain.  I cannot appreciate any femoral pulses on exam and she certainly has no appreciable pedal pulses.  Her ABIs are severely depressed in the 0.3 range with monophasic waveforms.  I think she likely has a chronic aortic occlusion given her exam and history.  I am going to get a CTA abdomen pelvis with bilateral lower extremity runoff.  Discussed the patient would likely need an aortobifemoral bypass but I need to get more imaging in order to make that determination.  While we are waiting on her CT which we will try and get as urgently as possible, we will also get her into see cardiology given that she had a previous CABG and states she has no cardiologist and no follow-up.  I will have her come back and see me as soon as her CTA is done and we can make plans to proceed with  surgery.   Marty Heck, MD [facility] Office: [phone] Pager: [place]

## 2019-02-01 NOTE — Telephone Encounter (Signed)
Did not need this encounter °

## 2019-02-02 ENCOUNTER — Other Ambulatory Visit: Payer: Self-pay | Admitting: Vascular Surgery

## 2019-02-02 ENCOUNTER — Ambulatory Visit
Admission: RE | Admit: 2019-02-02 | Discharge: 2019-02-02 | Disposition: A | Discharge status: Home / Self Care | Payer: Medicaid Other | Source: Ambulatory Visit | Attending: Vascular Surgery | Admitting: Vascular Surgery

## 2019-02-02 DIAGNOSIS — I998 Other disorder of circulatory system: Secondary | ICD-10-CM

## 2019-02-02 MED ORDER — IOPAMIDOL (ISOVUE-370) INJECTION 76%
100.0000 mL | Freq: Once | INTRAVENOUS | Status: AC | PRN
  Administered 2019-02-02: 100 mL via INTRAVENOUS

## 2019-02-03 ENCOUNTER — Other Ambulatory Visit: Payer: Self-pay

## 2019-02-03 ENCOUNTER — Ambulatory Visit (INDEPENDENT_AMBULATORY_CARE_PROVIDER_SITE_OTHER): Payer: Medicaid Other | Admitting: Cardiology

## 2019-02-03 ENCOUNTER — Encounter: Payer: Self-pay | Admitting: Cardiology

## 2019-02-03 VITALS — BP 155/94 | HR 102 | Ht 65.0 in | Wt 92.0 lb

## 2019-02-03 DIAGNOSIS — Z8774 Personal history of (corrected) congenital malformations of heart and circulatory system: Secondary | ICD-10-CM

## 2019-02-03 DIAGNOSIS — I700 Atherosclerosis of aorta: Secondary | ICD-10-CM

## 2019-02-03 DIAGNOSIS — E7800 Pure hypercholesterolemia, unspecified: Secondary | ICD-10-CM

## 2019-02-03 DIAGNOSIS — I7410 Embolism and thrombosis of unspecified parts of aorta: Secondary | ICD-10-CM

## 2019-02-03 DIAGNOSIS — R0989 Other specified symptoms and signs involving the circulatory and respiratory systems: Secondary | ICD-10-CM

## 2019-02-03 DIAGNOSIS — I739 Peripheral vascular disease, unspecified: Secondary | ICD-10-CM

## 2019-02-03 DIAGNOSIS — I10 Essential (primary) hypertension: Secondary | ICD-10-CM

## 2019-02-03 DIAGNOSIS — Z01810 Encounter for preprocedural cardiovascular examination: Secondary | ICD-10-CM

## 2019-02-03 DIAGNOSIS — F17200 Nicotine dependence, unspecified, uncomplicated: Secondary | ICD-10-CM

## 2019-02-03 MED ORDER — ATORVASTATIN CALCIUM 40 MG PO TABS: 40.0000 mg | ORAL_TABLET | Freq: Every day | ORAL | 1 refills | Status: AC

## 2019-02-03 MED ORDER — METOPROLOL SUCCINATE ER 25 MG PO TB24: 25.0000 mg | ORAL_TABLET | Freq: Every day | ORAL | 1 refills | Status: AC

## 2019-02-03 NOTE — Progress Notes (Signed)
Primary Physician:  Boyce Medici, FNP   Patient ID: Kristina Mayer, female    DOB: 1961-07-05, 58 y.o.   MRN: [id number]  Subjective:    Chief Complaint  Patient presents with  . Hypertension  . New Patient (Initial Visit)    HPI: Kristina Mayer  is a 58 y.o. female  With hypertension, hyperlipidemia, history of CVA in 2012, history PDA repair along with 2 valve replacement (unsure which valve) at age 25, heavy tobacco use, referred to [place] by Dr. Monica Martinez on stat basis for preoperative cardiac risk stratification for upcoming potential aortofemoral bypass for critical limb ischemia.  She denies any chest pain, shortness of breath, palpitations. She is not active due to her leg pain. States that she is unable to walk any distance. States that toes are turning colors. Denies that one leg is worse than the other. No ulcerations. Symptoms have been present for the last few years.   She states that hypertension and hyperlipidemia are well controlled. No history of diabetes. She is 1 pack per day smoker for the last 40 years.   States that she was previously followed by a Film/video editor, but has not been seen by one in the last 10-12 years. States her previous cardiac procedure was performed in [place].   Past Medical History:  Diagnosis Date  . Allergic rhinitis   . Allergy   . Blood transfusion   . Cervical cancer ([place])   . Chronic headaches   . Cyst    by left eye  . Hyperlipidemia   . Hypertension   . Stroke ([place]) 2012   . 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 ABDOMINAL HYSTERECTOMY    . CARDIAC VALVE SURGERY    . COLONOSCOPY      Social History   Socioeconomic History  . Marital status: Widowed    Spouse name: Not on file  . Number of children: 2  . Years of education: Not on file  . Highest education level: Not on file  Occupational History  . Occupation: Unemployed   Social Needs  . Financial  resource strain: Not on file  . Food insecurity    Worry: Not on file    Inability: Not on file  . Transportation needs    Medical: Not on file    Non-medical: Not on file  Tobacco Use  . Smoking status: Current Every Day Smoker    Packs/day: 1.00    Years: 40.00    Pack years: 40.00    Types: Cigarettes    Last attempt to quit: 09/06/2014    Years since quitting: 4.4  . Smokeless tobacco: Never Used  . Tobacco comment: Counseling sheet given to patient in exam room   Substance and Sexual Activity  . Alcohol use: No  . Drug use: No  . Sexual activity: Yes    Birth control/protection: Surgical  Lifestyle  . Physical activity    Days per week: Not on file    Minutes per session: Not on file  . Stress: Not on file  Relationships  . Social Herbalist on phone: Not on file    Gets together: Not on file    Attends religious service: Not on file    Active member of club or organization: Not on file    Attends meetings of clubs or organizations: Not on file    Relationship status: Not on file  .  Intimate partner violence    Fear of current or ex partner: Not on file    Emotionally abused: Not on file    Physically abused: Not on file    Forced sexual activity: Not on file  Other Topics Concern  . Not on file  Social History Narrative   Daily caffeine     Review of Systems  Constitution: Negative for decreased appetite, malaise/fatigue, weight gain and weight loss.  Eyes: Negative for visual disturbance.  Cardiovascular: Positive for claudication. Negative for chest pain, dyspnea on exertion, leg swelling, orthopnea, palpitations and syncope.  Respiratory: Negative for hemoptysis and wheezing.   Endocrine: Negative for cold intolerance and heat intolerance.  Hematologic/Lymphatic: Does not bruise/bleed easily.  Skin: Negative for nail changes.  Musculoskeletal: Negative for muscle weakness and myalgias.  Gastrointestinal: Negative for abdominal pain, change in  bowel habit, nausea and vomiting.  Neurological: Negative for difficulty with concentration, dizziness, focal weakness and headaches.  Psychiatric/Behavioral: Negative for altered mental status and suicidal ideas.  All other systems reviewed and are negative.     Objective:  Blood pressure (!) 155/94, pulse (!) 102, height 5\' 5"  (1.651 m), weight 92 lb (41.7 kg), SpO2 98 %. Body mass index is 15.31 kg/m.    Physical Exam  Constitutional: She is oriented to person, place, and time. Vital signs are normal. She appears well-developed and well-nourished.  HENT:  Head: Normocephalic and atraumatic.  Neck: Normal range of motion.  Cardiovascular: Normal rate, regular rhythm, normal heart sounds and intact distal pulses.  Pulses:      Carotid pulses are on the right side with bruit.      Femoral pulses are 0 on the right side and 0 on the left side.      Popliteal pulses are 0 on the right side and 0 on the left side.       Dorsalis pedis pulses are 0 on the right side and 0 on the left side.       Posterior tibial pulses are 0 on the right side and 0 on the left side.  Ischemic changes bilaterally  Pulmonary/Chest: Effort normal and breath sounds normal. No accessory muscle usage. No respiratory distress.  Abdominal: Soft. Bowel sounds are normal.  Musculoskeletal: Normal range of motion.  Neurological: She is alert and oriented to person, place, and time.  Skin: Skin is warm and dry.  Vitals reviewed.  Radiology:  CT angio AO + bifem 02/02/2019: Advanced aortic atherosclerosis, with bulky calcified and soft plaque of the juxtarenal aorta/infrarenal aorta nearly filling the aortic lumen, and contributing to occlusion just above the IMA takeoff. Reconstitution of the distal aorta via collateral flow, primarily via enlarged Winslow pathway. Aortic Atherosclerosis (ICD10-I70.0).  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 IMA remains patent beyond the aortic occlusion, though is likely stenotic at the origin.  Bilateral left greater than right renal arterial disease with high-grade stenosis of the main left renal artery and associated renal cortical thinning.  Bilateral iliac arterial disease with at least 50% narrowing at 2 locations of the right common iliac artery, and more mild left common iliac arterial disease.  There is relatively minimal atherosclerotic changes of the bilateral external iliac arteries, femoropopliteal arteries, and tibial arteries. The bilateral anterior tibial arteries and posterior tibial arteries are patent to the ankles, with decreased contrast of the bilateral peroneal arteries distally, either secondary  to distal embolization or potentially from late arrival of the contrast bolus.  Emphysema (ICD10-J43.9).    Laboratory examination:    CMP Latest Ref Rng & Units 04/16/2018 12/09/2013 06/13/2013  Glucose 70 - 99 mg/dL 170(H) 109 107(H)  BUN 6 - 20 mg/dL 7 - 3(L)  Creatinine 0.44 - 1.00 mg/dL 0.98 - 0.55  Sodium 135 - 145 mmol/L 135 - 141  Potassium 3.5 - 5.1 mmol/L 3.2(L) - 3.7  Chloride 98 - 111 mmol/L 106 - 105  CO2 22 - 32 mmol/L 17(L) - 23  Calcium 8.9 - 10.3 mg/dL 8.9 - 9.3  Total Protein 6.5 - 8.1 g/dL 7.5 - 7.1  Total Bilirubin 0.3 - 1.2 mg/dL 0.5 - 0.1(L)  Alkaline Phos 38 - 126 U/L 120 - 119(H)  AST 15 - 41 U/L 17 - 16  ALT 0 - 44 U/L 8 - 13   CBC Latest Ref Rng & Units 04/16/2018 06/13/2013 12/09/2010  WBC 4.0 - 10.5 K/uL 13.7(H) 10.6(H) 9.6  Hemoglobin 12.0 - 15.0 g/dL 14.8 14.9 13.7  Hematocrit 36.0 - 46.0 % 42.7 41.3 39.5  Platelets 150 - 400 K/uL 332 306 271   Lipid Panel     Component Value Date/Time   CHOL 166 12/09/2013 1027   TRIG 215 (H) 12/09/2013 1027   HDL 43 12/09/2013 1027   CHOLHDL 3.9 12/09/2013 1027   VLDL 43 (H) 12/09/2013 1027   LDLCALC 80 12/09/2013 1027    HEMOGLOBIN A1C Lab Results  Component Value Date   HGBA1C 5.5 12/09/2013   MPG 111 12/09/2013   TSH No results for input(s): TSH in the last 8760 hours.  PRN Meds:. Medications Discontinued During This Encounter  Medication Reason  . amitriptyline (ELAVIL) 25 MG tablet Error  . amLODipine (NORVASC) 10 MG tablet Error  . bismuth subsalicylate (PEPTO BISMOL) 262 MG/15ML suspension Error  . metoprolol tartrate (LOPRESSOR) 25 MG tablet Error  . metroNIDAZOLE (FLAGYL) 500 MG tablet Error  . lovastatin (MEVACOR) 20 MG tablet Discontinued by provider   Current Meds  Medication Sig  . butalbital-acetaminophen-caffeine (FIORICET) 50-325-40 MG per tablet Take 1 tablet by mouth 2 (two) times daily as needed for headache.  . clopidogrel (PLAVIX) 75 MG tablet Take 75 mg by mouth 2 (two) times daily.  Marland Kitchen lisinopril (PRINIVIL,ZESTRIL) 20 MG tablet Take 20 mg by mouth daily.  Marland Kitchen loratadine (CLARITIN) 10 MG tablet Take 1 tablet (10 mg total) by mouth daily.  . mirtazapine (REMERON) 15 MG tablet Take 15 mg by mouth daily.   Marland Kitchen OVER THE COUNTER MEDICATION Apply 1 application topically daily. Theraworx topical cream  . potassium chloride (MICRO-K) 10 MEQ CR capsule Take 10 mEq by mouth daily.  . [DISCONTINUED] lovastatin (MEVACOR) 20 MG tablet Take 1 tablet (20 mg total) by mouth every morning.    Cardiac Studies:     Assessment:     ICD-10-CM   1. Preoperative cardiovascular examination  Z01.810   2. PAD (peripheral artery disease) (HCC)  I73.9 PCV MYOCARDIAL PERFUSION WITH LEXISCAN  3. Aortic occlusion (HCC)  I74.10   4. Essential hypertension, benign  I10 EKG 12-Lead    ECHOCARDIOGRAM COMPLETE  5. Tobacco use disorder  F17.200   6. S/P PDA repair  Z87.74   7. Pure hypercholesterolemia  E78.00   8. Right carotid bruit  R09.89     EKG 02/03/2019: Normal sinus rhythm at 88 bpm, left atrial abnormalitiy, normal axis, LVH.   Recommendations:   Patient with hypertension, hyperlipidemia,  ongoing tobacco  use, history of PDA repair and questionable valvular replacement as a child at age 69, referred to [place] on a stat basis for perioperative cardiac risk stratification for upcoming potential aortofemoral bypass for critical limb ischemia.  Patient likely had percutaneous approach PDA repair, question if she actually had valvular repair as well.  She will need an echocardiogram to exclude any structural abnormalities and for further evaluation.  Records of her surgery are not available and patient is unsure of exact details.  No murmurs are noted by physical exam.  No clinical evidence of heart failure.  She is without symptoms of chest pain or shortness of breath; however, her functional capacity is limited due to claudication symptoms.  She has occlusion of aorta by recent CTA.  She will need further cardiac evaluation prior to undergoing surgery.  We will schedule for Lexiscan nuclear stress testing.  I have placed order for testing to be performed on an urgent basis to not delay her surgery.  I had offered to have testing performed tomorrow; however, patient stated that she would be unable to do that due to her uncle who is actively dying and she wishes to be with her family.  She is aware of continued risk of delaying her surgery.  She will be available to have this done on Monday and is agreeable to doing so.  Stressed the importance of having the testing performed so that she may proceed with her surgery if cleared from cardiac standpoint.  She is aware of risk of losing both of her legs.  Blood pressure is elevated today.  I will start her on metoprolol succinate 25 mg daily both for hypertension and cardiac protection.  I will change lovastatin to Lipitor due to efficacy.  She is currently on Plavix.  She does have right carotid bruit on physical exam, will defer to vascular surgery for their evaluation.  I have strongly urged her to quit smoking in view of her critical limb ischemia.  She  states she will work on this.  I will see her back after the test for further recommendations and reevaluation.   *I have discussed this case with Dr. Einar Gip and he personally examined the patient and participated in formulating the plan.*   Miquel Dunn, MSN, APRN, FNP-C [facility]. [place] Office: [phone] Fax: [phone]

## 2019-02-06 ENCOUNTER — Encounter: Payer: Self-pay | Admitting: Cardiology

## 2019-02-07 ENCOUNTER — Ambulatory Visit (INDEPENDENT_AMBULATORY_CARE_PROVIDER_SITE_OTHER): Payer: Medicaid Other

## 2019-02-07 ENCOUNTER — Ambulatory Visit (HOSPITAL_COMMUNITY)
Admission: RE | Admit: 2019-02-07 | Discharge: 2019-02-07 | Disposition: A | Discharge status: Home / Self Care | Payer: Medicaid Other | Source: Ambulatory Visit | Attending: Cardiology | Admitting: Cardiology

## 2019-02-07 ENCOUNTER — Telehealth: Payer: Self-pay | Admitting: *Deleted

## 2019-02-07 ENCOUNTER — Other Ambulatory Visit: Payer: Self-pay

## 2019-02-07 DIAGNOSIS — I739 Peripheral vascular disease, unspecified: Secondary | ICD-10-CM

## 2019-02-07 DIAGNOSIS — I10 Essential (primary) hypertension: Secondary | ICD-10-CM

## 2019-02-07 DIAGNOSIS — F17200 Nicotine dependence, unspecified, uncomplicated: Secondary | ICD-10-CM | POA: Diagnosis not present

## 2019-02-07 DIAGNOSIS — E785 Hyperlipidemia, unspecified: Secondary | ICD-10-CM | POA: Diagnosis not present

## 2019-02-07 NOTE — Progress Notes (Signed)
Echocardiogram 2D Echocardiogram has been performed.  Oneal Deputy Malyiah Fellows 02/07/2019, 4:05 PM

## 2019-02-07 NOTE — Telephone Encounter (Signed)
Virtual Visit Pre-Appointment Phone Call  Today, I spoke with Kristina Mayer and performed the following actions:  1. I explained that we are currently trying to limit exposure to the COVID-19 virus by seeing patients at home rather than in the office.  I explained that the visits are best done by video, but can be done by telephone.  I asked the patient if a virtual visit that the patient would like to try instead of coming into the office. Kristina Mayer agreed to proceed with the virtual visit scheduled with Kristina Martinez MD on 02/08/19.     2. I confirmed the BEST phone number to call the day of the visit and- I included this in appointment notes.  3. I asked if the patient had access to (through a family member/friend) a smartphone with video capability to be used for her visit?"  The patient said yes -    4. I confirmed consent by  a. sending through [place] or by email the [place] as written at the end of this message or  b. verbally as listed below. i. This visit is being performed in the setting of COVID-19. ii. All virtual visits are billed to your insurance company just like a normal visit would be.   iii. We'd like you to understand that the technology does not allow for your provider to perform an examination, and thus may limit your provider's ability to fully assess your condition.  iv. If your provider identifies any concerns that need to be evaluated in person, we will make arrangements to do so.   v. Finally, though the technology is pretty good, we cannot assure that it will always work on either your or our end, and in the setting of a video visit, we may have to convert it to a phone-only visit.  In either situation, we cannot ensure that we have a secure connection.   vi. Are you willing to proceed?"  STAFF: Did the patient verbally acknowledge consent to telehealth visit? Document YES/NO here: YES  2. I advised the patient to be  prepared - I asked that the patient, on the day of her visit, record any information possible with the equipment at her home, such as blood pressure, pulse, oxygen saturation, and your weight and write them all down. I asked the patient to have a pen and paper handy nearby the day of the visit as well.  3. If the patient was scheduled for a video visit, I informed the patient that the visit with the doctor would start with a text to the smartphone # given to [place] by the patient.         If the patient was scheduled for a telephone call, I informed the patient that the visit with the doctor would start with a call to the telephone # given to [place] by the patient.  4. I Informed patient they will receive a phone call 15 minutes prior to their appointment time from a [place] or nurse to review medications, allergies, etc. to prepare for the visit.    TELEPHONE CALL NOTE  Kristina Mayer has been deemed a candidate for a follow-up tele-health visit to limit community exposure during the Covid-19 pandemic. I spoke with the patient via phone to ensure availability of phone/video source, confirm preferred email & phone number, and discuss instructions and expectations.  I reminded Kristina Mayer to be prepared with any vital sign and/or heart rhythm information  that could potentially be obtained via home monitoring, at the time of her visit. I reminded Kristina Mayer to expect a phone call prior to her visit.  Cleaster Corin, NT 02/07/2019 12:59 PM     FULL LENGTH CONSENT FOR TELE-HEALTH VISIT   I hereby voluntarily request, consent and authorize [facility] and its employed or contracted physicians, physician assistants, nurse practitioners or other licensed health care professionals (the Practitioner), to provide me with telemedicine health care services (the "Services") as deemed necessary by the treating Practitioner. I acknowledge and consent to receive the Services by the Practitioner via telemedicine. I  understand that the telemedicine visit will involve communicating with the Practitioner through live audiovisual communication technology and the disclosure of certain medical information by electronic transmission. I acknowledge that I have been given the opportunity to request an in-person assessment or other available alternative prior to the telemedicine visit and am voluntarily participating in the telemedicine visit.  I understand that I have the right to withhold or withdraw my consent to the use of telemedicine in the course of my care at any time, without affecting my right to future care or treatment, and that the Practitioner or I may terminate the telemedicine visit at any time. I understand that I have the right to inspect all information obtained and/or recorded in the course of the telemedicine visit and may receive copies of available information for a reasonable fee.  I understand that some of the potential risks of receiving the Services via telemedicine include:  [place] Kitchen Delay or interruption in medical evaluation due to technological equipment failure or disruption; . Information transmitted may not be sufficient (e.g. poor resolution of images) to allow for appropriate medical decision making by the Practitioner; and/or  . In rare instances, security protocols could fail, causing a breach of personal health information.  Furthermore, I acknowledge that it is my responsibility to provide information about my medical history, conditions and care that is complete and accurate to the best of my ability. I acknowledge that Practitioner's advice, recommendations, and/or decision may be based on factors not within their control, such as incomplete or inaccurate data provided by me or distortions of diagnostic images or specimens that may result from electronic transmissions. I understand that the practice of medicine is not an exact science and that Practitioner makes no warranties or guarantees  regarding treatment outcomes. I acknowledge that I will receive a copy of this consent concurrently upon execution via email to the email address I last provided but may also request a printed copy by calling the office of [place].    I understand that my insurance will be billed for this visit.   I have read or had this consent read to me. . I understand the contents of this consent, which adequately explains the benefits and risks of the Services being provided via telemedicine.  . I have been provided ample opportunity to ask questions regarding this consent and the Services and have had my questions answered to my satisfaction. . I give my informed consent for the services to be provided through the use of telemedicine in my medical care  By participating in this telemedicine visit I agree to the above.

## 2019-02-08 ENCOUNTER — Ambulatory Visit (INDEPENDENT_AMBULATORY_CARE_PROVIDER_SITE_OTHER): Payer: Medicaid Other | Admitting: Vascular Surgery

## 2019-02-08 ENCOUNTER — Other Ambulatory Visit: Payer: Self-pay

## 2019-02-08 ENCOUNTER — Encounter: Payer: Self-pay | Admitting: Vascular Surgery

## 2019-02-08 ENCOUNTER — Ambulatory Visit: Payer: Medicaid Other | Admitting: Cardiology

## 2019-02-08 VITALS — BP 134/70 | Ht 65.0 in | Wt 92.0 lb

## 2019-02-08 DIAGNOSIS — I7410 Embolism and thrombosis of unspecified parts of aorta: Secondary | ICD-10-CM

## 2019-02-08 DIAGNOSIS — I70229 Atherosclerosis of native arteries of extremities with rest pain, unspecified extremity: Secondary | ICD-10-CM

## 2019-02-08 DIAGNOSIS — I700 Atherosclerosis of aorta: Secondary | ICD-10-CM

## 2019-02-08 DIAGNOSIS — I998 Other disorder of circulatory system: Secondary | ICD-10-CM | POA: Diagnosis not present

## 2019-02-08 NOTE — Progress Notes (Addendum)
Virtual Visit via Telephone Note   I connected with [name] on 02/08/2019 using the [employer] by telephone and verified that I was speaking with the correct person using two identifiers.  The limitations of evaluation and management by telemedicine and the availability of in person appointments have been previously discussed with the patient and are documented in the patients chart. The patient expressed understanding and consented to proceed.  PCP: Boyce Medici, FNP  Chief Complaint: Bilateral leg pain, aortic occlusion  History of Present Illness: Kristina Mayer is a 58 y.o. female with with history of hypertension, hyperlipidemia, previous stroke as well as a remote CABG there was initially referred for bilateral leg pain.  She has had leg pain for 3 years initially described as claudication progressing since late last year now with some early rest pain symptoms.  When I examined her last week she had no femoral pulses and no pedal pulses all consistent with aortic occlusion.  Ultimately I sent her for a CTA abdomen pelvis and lower extremity runoff.  She is on the schedule today for a phone call to follow-up after the CT scan.  She has also been referred to cardiology and is seeing Dr. Einar Gip with echocardiogram and possible stress test.  Reports her legs are stable on the phone.  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   butalbital-acetaminophen-caffeine (FIORICET) 50-325-40 MG per tablet Take 1 tablet by  mouth 2 (two) times daily as needed for headache.   clopidogrel (PLAVIX) 75 MG tablet Take 75 mg by mouth 2 (two) times daily.   lisinopril (PRINIVIL,ZESTRIL) 20 MG tablet Take 20 mg by mouth daily.   loratadine (CLARITIN) 10 MG tablet Take 1 tablet (10 mg total) by mouth daily.   metoprolol succinate (TOPROL XL) 25 MG 24 hr tablet Take 1 tablet (25 mg total) by mouth daily.   OVER THE COUNTER MEDICATION Apply 1 application topically daily. Theraworx topical cream   potassium chloride (MICRO-K) 10 MEQ CR capsule Take 10 mEq by mouth daily.    12 system ROS was negative unless otherwise noted in HPI   Observations/Objective:  CTA 02/02/19:  Advanced aortic atherosclerosis, with bulky calcified and soft plaque of the juxtarenal aorta/infrarenal aorta nearly filling the aortic lumen, and contributing to occlusion just above the IMA takeoff. Reconstitution of the distal aorta via collateral flow, primarily via enlarged Winslow pathway. Aortic Atherosclerosis (ICD10-I70.0).  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 IMA remains patent beyond the aortic occlusion, though is likely stenotic at the origin.  Bilateral left greater than right renal arterial disease with high-grade stenosis of the main left renal artery and associated renal cortical thinning.  Bilateral iliac arterial disease with at least 50% narrowing at 2 locations of the right  common iliac artery, and more mild left common iliac arterial disease.  There is relatively minimal atherosclerotic changes of the bilateral external iliac arteries, femoropopliteal arteries, and tibial arteries. The bilateral anterior tibial arteries and posterior tibial arteries are patent to the ankles, with decreased contrast of the bilateral peroneal arteries distally, either secondary to  distal embolization or potentially from late arrival of the contrast bolus.  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  Discussed with her in detail that I think she needs an aortobifemoral bypass and we will schedule this for August 10.  I went over all the risks and benefits and quoted her at least 30% risk of major complication including bleeding, bowel injury, ureter injury, require return to the OR, graft infection and or even death.  Discussed she be at high risk for limb loss with no intervention given the progression of her symptoms over the last year.  She has bulky calcific disease that extends up to her visceral segment including at the level of the renal arteries as well as just below the SMA.  Discussed that we would have to potentially perform SMA bypass and/or reimplant the renal artery if needed.  Waiting on her cardiac work-up with Dr. Einar Gip to ensure that she can tolerate open aortic surgery given some history of open cardiac surgery in the past with no follow-up.  She needs to hold her Plavix 5-7 days before surgery.  Follow Up Instructions:  Plan for aortobifemoral bypass on 03/13/2019.   I discussed the assessment and treatment plan with the patient. The patient was provided an opportunity to ask questions and all were answered. The patient agreed with the plan and demonstrated an understanding of the instructions.   The patient was advised to call back or seek an in-person evaluation if the symptoms worsen or if the condition fails to improve as anticipated.  I spent 11 minutes with the patient via telephone encounter.   Signed, Marty Heck [facility] Office: [phone]  02/08/2019, 10:45 AM

## 2019-02-10 ENCOUNTER — Other Ambulatory Visit: Payer: Self-pay | Admitting: *Deleted

## 2019-02-14 ENCOUNTER — Encounter (HOSPITAL_COMMUNITY): Payer: Self-pay

## 2019-02-14 ENCOUNTER — Other Ambulatory Visit: Payer: Medicaid Other

## 2019-02-14 NOTE — Progress Notes (Signed)
Anesthesia Chart Review:  Case: [id number] Date/Time: 02/13/2019 0715   Procedure: AORTOBIFEMORAL BYPASS GRAFT POSSIBLE MESENTERIC BYPASS (N/A )   Anesthesia type: General   Pre-op diagnosis: aortoiliac occlusive disease   Location: [facility] OR ROOM 11 / [place] OR   Surgeon: Marty Heck, MD      DISCUSSION: Patient is a 58 year old female scheduled for the above procedure.  History includes smoking, aortoiliac occlusive disease, HTN, HLD, CVA (2012), chronic headaches, cervical cancer (s/p hysterectomy), PDA with possible "cardiac valve surgery" (~ age 66 years in [place]; 02/07/2019 echo did not show an atrial level shunt or indicate any findings of valvular protheses or significant valve disease), left renal artery stenosis (by 02/02/2019 CTA).  Patient seen by Jeri Lager, NP (and discussed with Adrian Prows, MD) on 02/03/2019 at [facility] for preoperative cardiac risk stratification for upcoming AFBG. She had not seen a cardiologist is > 10 years. Given the unclear details regarding previous cardiac procedure, an echo was ordered which showed normal LVEF, no atrial level shunt, and no significant valvular abnormalities. She also had a low risk stress test. B-blocker was added for hypertension and cardiac protection and lovastatin changes to Lipitor. Decision regarding future carotid US deferred to vascular surgery. Letter on 02/15/19 (see Letters tab), indicates patient "is at low risk, from a cardiac standpoint, For her upcoming procedure.  It is ok to proceed without further cardiac testing."  Per [facility], patient to hold Plavix 5 days prior to surgery. Continuing ASA except day of surgery.   Preoperative labs acceptable, although unable to get UA at PAT, so will need to be done on or before the day of surgery. I've sent a staff message to Dr. Carlis Abbott to see if he wants UA before the day of surgery. Patient is for presurgical COVID-19 test on 02/17/19.     VS: BP (!) (P) 112/59   Pulse (P)  80   Temp (P) 37.3 C   Resp (P) 18   Ht (P) 5\' 5"  (1.651 m)   Wt (P) 41.1 kg   SpO2 (P) 99%   BMI (P) 15.08 kg/m     PROVIDERS: O[place], Coolidge Breeze, FNP is PCP  Adrian Prows, MD is cardiologist   LABS: Labs reviewed: Acceptable for surgery. Preoperative UA still needed per surgeon orders.   (all labs ordered are listed, but only abnormal results are displayed)  Labs Reviewed  SURGICAL PCR SCREEN - Abnormal; Notable for the following components:      Result Value   Staphylococcus aureus POSITIVE (*)    All other components within normal limits  BLOOD GAS, ARTERIAL - Abnormal; Notable for the following components:   pO2, Arterial 118 (*)    All other components within normal limits  COMPREHENSIVE METABOLIC PANEL - Abnormal; Notable for the following components:   CO2 20 (*)    Glucose, Bld 110 (*)    BUN 5 (*)    Calcium 8.6 (*)    Total Protein 6.0 (*)    Albumin 3.4 (*)    All other components within normal limits  APTT  CBC  PROTIME-INR  TYPE AND SCREEN  PREPARE RBC (CROSSMATCH)  ABO/RH     IMAGES: CT angio AO + bifem 02/02/2019:  IMPRESSION: - Advanced aortic atherosclerosis, with bulky calcified and soft plaque of the juxtarenal aorta/infrarenal aorta nearly filling the aortic lumen, and contributing to occlusion just above the IMA takeoff. Reconstitution of the distal aorta via collateral flow, primarily via enlarged Winslow pathway.  Aortic Atherosclerosis (ICD10-I70.0). 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 IMA remains patent beyond the aortic occlusion, though is likely stenotic at the origin. - Bilateral left greater than right renal arterial disease with high-grade stenosis of the main left renal artery and associated renal cortical thinning. - Bilateral iliac arterial disease with at least 50% narrowing at 2 locations of the  right common iliac artery, and more mild left common iliac arterial disease. - There is relatively minimal atherosclerotic changes of the bilateral external iliac arteries, femoropopliteal arteries, and tibial arteries. The bilateral anterior tibial arteries and posterior tibial arteries are patent to the ankles, with decreased contrast of the bilateral peroneal arteries distally, either secondary to distal embolization or potentially from late arrival of the contrast bolus. - Emphysema (ICD10-J43.9).   CXR 04/16/2018: IMPRESSION: - Hyperinflation. Biapical scarring. No active cardiopulmonary disease. - Slight compression deformities of several mid and lower thoracic vertebral bodies, age indeterminate.   EKG:02/03/2019: Normal sinus rhythm at 88 bpm, left atrial abnormalitiy, normal axis, LVH.    CV: Lexiscan Myoview Stress Test 02/07/2019: Stress EKG is non-diagnostic, as this is pharmacological stress test. Myocardial perfusion imaging is normal. Left ventricular ejection fraction is  64% with normal wall motion. Low risk study.  Echo 02/07/2019: IMPRESSIONS  1. The left ventricle has normal systolic function, with an ejection fraction of 55-60%. The cavity size was normal. Left ventricular diastolic Doppler parameters are consistent with impaired relaxation.  2. The right ventricle has normal systolic function. The cavity was normal.  3. The mitral valve is grossly normal.  4. The tricuspid valve is grossly normal.  5. The aortic valve is tricuspid. No stenosis of the aortic valve.  6. The aorta is normal in size and structure.  7. Normal LV systolic function; mild diastolic dysfunction.  FINDINGS - Left Ventricle: The left ventricle has normal systolic function, with an ejection fraction of 55-60%. The cavity size was normal. There is no increase in left ventricular wall thickness. Left ventricular diastolic Doppler parameters are consistent with  impaired relaxation. - Right  Ventricle: The right ventricle has normal systolic function. The cavity was normal. - Left Atrium: Left atrial size was normal in size. - Right Atrium: Right atrial size was normal in size. - Interatrial Septum: No atrial level shunt detected by color flow Doppler. - Pericardium: There is no evidence of pericardial effusion. - Mitral Valve: The mitral valve is grossly normal. Mitral valve regurgitation is not visualized by color flow Doppler. - Tricuspid Valve: The tricuspid valve is grossly normal. Tricuspid valve regurgitation is trivial by color flow Doppler. - Aortic Valve: The aortic valve is tricuspid. Aortic valve regurgitation was not visualized by color flow Doppler. There is No stenosis of the aortic valve. - Pulmonic Valve: The pulmonic valve was grossly normal. Pulmonic valve regurgitation is not visualized by color flow Doppler. - Aorta: The aorta is normal in size and structure. - Venous: The inferior vena cava is normal in size with greater than 50% respiratory variability. - Additional Comments: Normal LV systolic function; mild diastolic dysfunction.    Past Medical History:  Diagnosis Date  . Allergic rhinitis   . Allergy   . Aortoiliac occlusive disease ([place])   . Blood transfusion   . Cervical cancer ([place])   . Chronic headaches   . Coronary artery disease   . Cyst    by left eye  .  Hyperlipidemia   . Hypertension   . Stroke ([place]) 2012   . 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 ABDOMINAL HYSTERECTOMY    . CARDIAC VALVE SURGERY    . COLONOSCOPY    . EYE SURGERY      MEDICATIONS: . aspirin EC 325 MG tablet  . atorvastatin (LIPITOR) 40 MG tablet  . butalbital-acetaminophen-caffeine (FIORICET) 50-325-40 MG per tablet  . clopidogrel (PLAVIX) 75 MG tablet  . DULoxetine (CYMBALTA) 30 MG capsule  . lisinopril (PRINIVIL,ZESTRIL) 20 MG tablet  . loratadine (CLARITIN) 10 MG tablet  . metoprolol succinate  (TOPROL XL) 25 MG 24 hr tablet  . OVER THE COUNTER MEDICATION  . potassium chloride (MICRO-K) 10 MEQ CR capsule   No current facility-administered medications for this encounter.      Myra Gianotti, PA-C Surgical Short Stay/Anesthesiology [facility] Phone [phone] [facility] Phone [phone] 02/15/2019 5:23 PM

## 2019-02-14 NOTE — Progress Notes (Signed)
[facility] [phone] - [place], [place] - [address] [place] [address] Phone: [phone] Fax: [phone]    Your procedure is scheduled on Monday, August 10th.  Report to [facility] Main Entrance "A" at 5:30 A.M., and check in at the Admitting office.  Call this number if you have problems the morning of surgery:  [phone]  Call [phone] if you have any questions prior to your surgery date Monday-Friday 8am-4pm   Remember:  Do not eat or drink after midnight the night before your surgery   Take these medicines the morning of surgery with A SIP OF WATER atorvastatin (LIPITOR)  DULoxetine (CYMBALTA) loratadine (CLARITIN)  metoprolol succinate (TOPROL XL)  Follow your surgeon's instructions on when to stop Aspirin and Plavix.  If no instructions were given by your surgeon then you will need to call the office to get those instructions.    7 days prior to surgery STOP taking any Aspirin (unless otherwise instructed by your surgeon), Aleve, Naproxen, Ibuprofen, Motrin, Advil, Goody's, BC's, all herbal medications, fish oil, and all vitamins.   The Morning of Surgery  Do not wear jewelry, make-up or nail polish.  Do not wear lotions, powders, or perfumes/colognes, or deodorant  Do not shave 48 hours prior to surgery.   Do not bring valuables to the hospital.  [facility] is not responsible for any belongings or valuables.  If you are a smoker, DO NOT Smoke 24 hours prior to surgery IF you wear a CPAP at night please bring your mask, tubing, and machine the morning of surgery   Remember that you must have someone to transport you home after your surgery, and remain with you for 24 hours if you are discharged the same day.  Contacts, glasses, hearing aids, dentures or bridgework may not be worn into surgery.   Leave your suitcase in the car.  After surgery it may be brought to your  room.  For patients admitted to the hospital, discharge time will be determined by your treatment team.  Patients discharged the day of surgery will not be allowed to drive home.   Special instructions:   [place]- Preparing For Surgery  Before surgery, you can play an important role. Because skin is not sterile, your skin needs to be as free of germs as possible. You can reduce the number of germs on your skin by washing with CHG (chlorahexidine gluconate) Soap before surgery.  CHG is an antiseptic cleaner which kills germs and bonds with the skin to continue killing germs even after washing.    Oral Hygiene is also important to reduce your risk of infection.  Remember - BRUSH YOUR TEETH THE MORNING OF SURGERY WITH YOUR REGULAR TOOTHPASTE  Please do not use if you have an allergy to CHG or antibacterial soaps. If your skin becomes reddened/irritated stop using the CHG.  Do not shave (including legs and underarms) for at least 48 hours prior to first CHG shower. It is OK to shave your face.  Please follow these instructions carefully.   1. Shower the NIGHT BEFORE SURGERY and the MORNING OF SURGERY with CHG Soap.   2. If you chose to wash your hair, wash your hair first as usual with your normal shampoo.  3. After you shampoo, rinse your hair and body thoroughly to remove the shampoo.  4. Use CHG as you would any other liquid soap. You can apply CHG directly to the skin  and wash gently with a scrungie or a clean washcloth.   5. Apply the CHG Soap to your body ONLY FROM THE NECK DOWN.  Do not use on open wounds or open sores. Avoid contact with your eyes, ears, mouth and genitals (private parts). Wash Face and genitals (private parts)  with your normal soap.   6. Wash thoroughly, paying special attention to the area where your surgery will be performed.  7. Thoroughly rinse your body with warm water from the neck down.  8. DO NOT shower/wash with your normal soap after using and  rinsing off the CHG Soap.  9. Pat yourself dry with a CLEAN TOWEL.  10. Wear CLEAN PAJAMAS to bed the night before surgery, wear comfortable clothes the morning of surgery  11. Place CLEAN SHEETS on your bed the night of your first shower and DO NOT SLEEP WITH PETS.  Day of Surgery: Do not apply any deodorants/lotions. Please shower the morning of surgery with the CHG soap  Please wear clean clothes to the hospital/surgery center.   Remember to brush your teeth WITH YOUR REGULAR TOOTHPASTE.  Please read over the following fact sheets that you were given.

## 2019-02-15 ENCOUNTER — Encounter: Payer: Self-pay | Admitting: Cardiology

## 2019-02-15 ENCOUNTER — Encounter (HOSPITAL_COMMUNITY): Payer: Self-pay

## 2019-02-15 ENCOUNTER — Encounter (HOSPITAL_COMMUNITY)
Admission: RE | Admit: 2019-02-15 | Discharge: 2019-02-15 | Disposition: A | Discharge status: Home / Self Care | Payer: Medicaid Other | Source: Ambulatory Visit | Attending: Vascular Surgery | Admitting: Vascular Surgery

## 2019-02-15 ENCOUNTER — Other Ambulatory Visit: Payer: Self-pay

## 2019-02-15 DIAGNOSIS — E785 Hyperlipidemia, unspecified: Secondary | ICD-10-CM | POA: Insufficient documentation

## 2019-02-15 DIAGNOSIS — Z7902 Long term (current) use of antithrombotics/antiplatelets: Secondary | ICD-10-CM | POA: Insufficient documentation

## 2019-02-15 DIAGNOSIS — I10 Essential (primary) hypertension: Secondary | ICD-10-CM | POA: Insufficient documentation

## 2019-02-15 DIAGNOSIS — Z8673 Personal history of transient ischemic attack (TIA), and cerebral infarction without residual deficits: Secondary | ICD-10-CM | POA: Insufficient documentation

## 2019-02-15 DIAGNOSIS — Z79899 Other long term (current) drug therapy: Secondary | ICD-10-CM | POA: Insufficient documentation

## 2019-02-15 DIAGNOSIS — Z8541 Personal history of malignant neoplasm of cervix uteri: Secondary | ICD-10-CM | POA: Insufficient documentation

## 2019-02-15 DIAGNOSIS — I2510 Atherosclerotic heart disease of native coronary artery without angina pectoris: Secondary | ICD-10-CM | POA: Insufficient documentation

## 2019-02-15 DIAGNOSIS — Z90710 Acquired absence of both cervix and uterus: Secondary | ICD-10-CM | POA: Diagnosis not present

## 2019-02-15 DIAGNOSIS — I779 Disorder of arteries and arterioles, unspecified: Secondary | ICD-10-CM | POA: Insufficient documentation

## 2019-02-15 DIAGNOSIS — Z01818 Encounter for other preprocedural examination: Secondary | ICD-10-CM | POA: Insufficient documentation

## 2019-02-15 DIAGNOSIS — Z7982 Long term (current) use of aspirin: Secondary | ICD-10-CM | POA: Diagnosis not present

## 2019-02-15 HISTORY — DX: Other arterial embolism and thrombosis of abdominal aorta: I74.09

## 2019-02-15 HISTORY — DX: Atherosclerotic heart disease of native coronary artery without angina pectoris: I25.10

## 2019-02-15 LAB — BLOOD GAS, ARTERIAL
Acid-Base Excess: 0.4 mmol/L (ref 0.0–2.0)
Bicarbonate: 24.1 mmol/L (ref 20.0–28.0)
Drawn by: 42180
FIO2: 21
O2 Saturation: 98.9 %
Patient temperature: 98.6
pCO2 arterial: 36.4 mmHg (ref 32.0–48.0)
pH, Arterial: 7.437 (ref 7.350–7.450)
pO2, Arterial: 118 mmHg — ABNORMAL HIGH (ref 83.0–108.0)

## 2019-02-15 LAB — CBC
HCT: 38.7 % (ref 36.0–46.0)
Hemoglobin: 12.9 g/dL (ref 12.0–15.0)
MCH: 33.3 pg (ref 26.0–34.0)
MCHC: 33.3 g/dL (ref 30.0–36.0)
MCV: 100 fL (ref 80.0–100.0)
Platelets: 319 10*3/uL (ref 150–400)
RBC: 3.87 MIL/uL (ref 3.87–5.11)
RDW: 14.7 % (ref 11.5–15.5)
WBC: 10.5 10*3/uL (ref 4.0–10.5)
nRBC: 0 % (ref 0.0–0.2)

## 2019-02-15 LAB — COMPREHENSIVE METABOLIC PANEL
ALT: 12 U/L (ref 0–44)
AST: 17 U/L (ref 15–41)
Albumin: 3.4 g/dL — ABNORMAL LOW (ref 3.5–5.0)
Alkaline Phosphatase: 114 U/L (ref 38–126)
Anion gap: 11 (ref 5–15)
BUN: 5 mg/dL — ABNORMAL LOW (ref 6–20)
CO2: 20 mmol/L — ABNORMAL LOW (ref 22–32)
Calcium: 8.6 mg/dL — ABNORMAL LOW (ref 8.9–10.3)
Chloride: 105 mmol/L (ref 98–111)
Creatinine, Ser: 0.88 mg/dL (ref 0.44–1.00)
GFR calc Af Amer: >60 mL/min (ref >60)
GFR calc non Af Amer: >60 mL/min (ref >60)
Glucose, Bld: 110 mg/dL — ABNORMAL HIGH (ref 70–99)
Potassium: 4.4 mmol/L (ref 3.5–5.1)
Sodium: 136 mmol/L (ref 135–145)
Total Bilirubin: 0.4 mg/dL (ref 0.3–1.2)
Total Protein: 6 g/dL — ABNORMAL LOW (ref 6.5–8.1)

## 2019-02-15 LAB — APTT: aPTT: 25 seconds (ref 24–36)

## 2019-02-15 LAB — PREPARE RBC (CROSSMATCH)

## 2019-02-15 LAB — SURGICAL PCR SCREEN
MRSA, PCR: NEGATIVE
Staphylococcus aureus: POSITIVE — AB

## 2019-02-15 LAB — PROTIME-INR
INR: 1 (ref 0.8–1.2)
Prothrombin Time: 13.2 seconds (ref 11.4–15.2)

## 2019-02-15 LAB — ABO/RH: ABO/RH(D): O POS

## 2019-02-15 NOTE — Progress Notes (Signed)
Mupirocin Ointment Rx called into [facility] on [address] for positive PCR of Staph. Left message on pt's voicemail with results and need to pick up Rx.

## 2019-02-15 NOTE — Progress Notes (Signed)
PCP - Annie Main, FNP Cardiologist - per patient "seen Dr. Einar Gip last week for a cardiac clearance for surgery but hasn't seen on in years"  Chest x-ray - denies EKG - 02/06/2019 Stress Test - 02/07/2019  ECHO - 02/07/2019 Cardiac Cath - denies  Sleep Study - denies CPAP - N/A  Blood Thinner Instructions: Hold 5 days prior surgery Aspirin Instructions: take as prescribed up until day of surgery  Anesthesia review: YES, cardiac history  Coronavirus Screening   Have you experienced the following symptoms:  Cough yes/no: No Fever (>100.64F)  yes/no: No Runny nose yes/no: No Sore throat yes/no: No Difficulty breathing/shortness of breath  yes/no: No  Have you or a family member traveled in the last 14 days and where? yes/no: No  If the patient indicates "YES" to the above questions, their PAT will be rescheduled to limit the exposure to others and, the surgeon will be notified. THE PATIENT WILL NEED TO BE ASYMPTOMATIC FOR 14 DAYS.   If the patient is not experiencing any of these symptoms, the PAT nurse will instruct them to NOT bring anyone with them to their appointment since they may have these symptoms or traveled as well.   Please remind your patients and families that hospital visitation restrictions are in effect and the importance of the restrictions.   Patient denies shortness of breath, fever, cough and chest pain at PAT appointment  Patient verbalized understanding of instructions that were given to them at the PAT appointment. Patient was also instructed that they will need to review over the PAT instructions again at home before surgery.

## 2019-02-15 NOTE — Anesthesia Preprocedure Evaluation (Addendum)
Anesthesia Evaluation  Patient identified by MRN, date of birth, ID band Patient awake    Reviewed: Allergy & Precautions, NPO status , Patient's Chart, lab work & pertinent test results  Airway Mallampati: II  TM Distance: >3 FB Neck ROM: Full    Dental  (+) Teeth Intact, Dental Advisory Given   Pulmonary Current Smoker and Patient abstained from smoking.,    breath sounds clear to auscultation       Cardiovascular hypertension,  Rhythm:Regular Rate:Normal     Neuro/Psych    GI/Hepatic   Endo/Other    Renal/GU      Musculoskeletal   Abdominal   Peds  Hematology   Anesthesia Other Findings   Reproductive/Obstetrics                            Anesthesia Physical Anesthesia Plan  ASA: III  Anesthesia Plan: General   Post-op Pain Management:    Induction: Intravenous  PONV Risk Score and Plan: Ondansetron and Dexamethasone  Airway Management Planned: Oral ETT  Additional Equipment:   Intra-op Plan:   Post-operative Plan: Extubation in OR  Informed Consent: I have reviewed the patients History and Physical, chart, labs and discussed the procedure including the risks, benefits and alternatives for the proposed anesthesia with the patient or authorized representative who has indicated his/her understanding and acceptance.     Dental advisory given  Plan Discussed with: CRNA and Anesthesiologist  Anesthesia Plan Comments: (PAT note written by Myra Gianotti, PA-C. )       Anesthesia Quick Evaluation

## 2019-02-17 ENCOUNTER — Other Ambulatory Visit (HOSPITAL_COMMUNITY)
Admission: RE | Admit: 2019-02-17 | Discharge: 2019-02-17 | Disposition: A | Discharge status: Home / Self Care | Payer: Medicaid Other | Source: Ambulatory Visit | Attending: Vascular Surgery | Admitting: Vascular Surgery

## 2019-02-17 DIAGNOSIS — Z01812 Encounter for preprocedural laboratory examination: Secondary | ICD-10-CM | POA: Diagnosis not present

## 2019-02-17 DIAGNOSIS — Z20828 Contact with and (suspected) exposure to other viral communicable diseases: Secondary | ICD-10-CM | POA: Insufficient documentation

## 2019-02-17 LAB — SARS CORONAVIRUS 2 (TAT 6-24 HRS): SARS Coronavirus 2: NEGATIVE

## 2019-02-18 MED ORDER — DEXTROSE 5 % IV SOLN
3.0000 g | INTRAVENOUS | Status: DC
  Filled 2019-02-18: qty 3000

## 2019-02-18 MED ORDER — CEFAZOLIN SODIUM-DEXTROSE 2-4 GM/100ML-% IV SOLN
2.0000 g | INTRAVENOUS | Status: AC
  Administered 2019-02-21 (×2): 2 g via INTRAVENOUS
  Filled 2019-02-18: qty 100

## 2019-02-21 ENCOUNTER — Inpatient Hospital Stay (HOSPITAL_COMMUNITY): Payer: Medicaid Other

## 2019-02-21 ENCOUNTER — Inpatient Hospital Stay (HOSPITAL_COMMUNITY): Payer: Medicaid Other | Admitting: Certified Registered"

## 2019-02-21 ENCOUNTER — Inpatient Hospital Stay (HOSPITAL_COMMUNITY): Payer: Medicaid Other | Admitting: Vascular Surgery

## 2019-02-21 ENCOUNTER — Encounter: Payer: Self-pay | Admitting: Gastroenterology

## 2019-02-21 ENCOUNTER — Encounter (HOSPITAL_COMMUNITY): Payer: Self-pay | Admitting: *Deleted

## 2019-02-21 ENCOUNTER — Other Ambulatory Visit: Payer: Self-pay

## 2019-02-21 ENCOUNTER — Inpatient Hospital Stay (HOSPITAL_COMMUNITY)
Admission: RE | Admit: 2019-02-21 | Discharge: 2019-03-15 | DRG: 268 | Disposition: E | Payer: Medicaid Other | Attending: Vascular Surgery | Admitting: Vascular Surgery

## 2019-02-21 ENCOUNTER — Encounter (HOSPITAL_COMMUNITY): Admission: RE | Disposition: E | Payer: Self-pay | Source: Home / Self Care | Attending: Vascular Surgery

## 2019-02-21 DIAGNOSIS — J309 Allergic rhinitis, unspecified: Secondary | ICD-10-CM | POA: Diagnosis present

## 2019-02-21 DIAGNOSIS — G92 Toxic encephalopathy: Secondary | ICD-10-CM | POA: Diagnosis not present

## 2019-02-21 DIAGNOSIS — Y838 Other surgical procedures as the cause of abnormal reaction of the patient, or of later complication, without mention of misadventure at the time of the procedure: Secondary | ICD-10-CM | POA: Diagnosis not present

## 2019-02-21 DIAGNOSIS — N170 Acute kidney failure with tubular necrosis: Secondary | ICD-10-CM | POA: Diagnosis not present

## 2019-02-21 DIAGNOSIS — I70223 Atherosclerosis of native arteries of extremities with rest pain, bilateral legs: Secondary | ICD-10-CM | POA: Diagnosis present

## 2019-02-21 DIAGNOSIS — R0682 Tachypnea, not elsewhere classified: Secondary | ICD-10-CM | POA: Diagnosis not present

## 2019-02-21 DIAGNOSIS — F17210 Nicotine dependence, cigarettes, uncomplicated: Secondary | ICD-10-CM | POA: Diagnosis present

## 2019-02-21 DIAGNOSIS — Z716 Tobacco abuse counseling: Secondary | ICD-10-CM

## 2019-02-21 DIAGNOSIS — Y9389 Activity, other specified: Secondary | ICD-10-CM | POA: Diagnosis not present

## 2019-02-21 DIAGNOSIS — J9690 Respiratory failure, unspecified, unspecified whether with hypoxia or hypercapnia: Secondary | ICD-10-CM

## 2019-02-21 DIAGNOSIS — Z90710 Acquired absence of both cervix and uterus: Secondary | ICD-10-CM

## 2019-02-21 DIAGNOSIS — X58XXXA Exposure to other specified factors, initial encounter: Secondary | ICD-10-CM | POA: Diagnosis not present

## 2019-02-21 DIAGNOSIS — I2510 Atherosclerotic heart disease of native coronary artery without angina pectoris: Secondary | ICD-10-CM | POA: Diagnosis present

## 2019-02-21 DIAGNOSIS — D696 Thrombocytopenia, unspecified: Secondary | ICD-10-CM | POA: Diagnosis not present

## 2019-02-21 DIAGNOSIS — I110 Hypertensive heart disease with heart failure: Secondary | ICD-10-CM | POA: Diagnosis present

## 2019-02-21 DIAGNOSIS — E785 Hyperlipidemia, unspecified: Secondary | ICD-10-CM | POA: Diagnosis present

## 2019-02-21 DIAGNOSIS — Z7902 Long term (current) use of antithrombotics/antiplatelets: Secondary | ICD-10-CM

## 2019-02-21 DIAGNOSIS — I771 Stricture of artery: Secondary | ICD-10-CM | POA: Diagnosis present

## 2019-02-21 DIAGNOSIS — J9601 Acute respiratory failure with hypoxia: Secondary | ICD-10-CM | POA: Diagnosis not present

## 2019-02-21 DIAGNOSIS — Z20828 Contact with and (suspected) exposure to other viral communicable diseases: Secondary | ICD-10-CM | POA: Diagnosis present

## 2019-02-21 DIAGNOSIS — K7200 Acute and subacute hepatic failure without coma: Secondary | ICD-10-CM | POA: Diagnosis not present

## 2019-02-21 DIAGNOSIS — Z515 Encounter for palliative care: Secondary | ICD-10-CM | POA: Diagnosis not present

## 2019-02-21 DIAGNOSIS — Z452 Encounter for adjustment and management of vascular access device: Secondary | ICD-10-CM

## 2019-02-21 DIAGNOSIS — I700 Atherosclerosis of aorta: Secondary | ICD-10-CM

## 2019-02-21 DIAGNOSIS — R188 Other ascites: Secondary | ICD-10-CM | POA: Diagnosis not present

## 2019-02-21 DIAGNOSIS — R571 Hypovolemic shock: Secondary | ICD-10-CM | POA: Diagnosis not present

## 2019-02-21 DIAGNOSIS — E872 Acidosis: Secondary | ICD-10-CM | POA: Diagnosis not present

## 2019-02-21 DIAGNOSIS — E8770 Fluid overload, unspecified: Secondary | ICD-10-CM | POA: Diagnosis not present

## 2019-02-21 DIAGNOSIS — Z951 Presence of aortocoronary bypass graft: Secondary | ICD-10-CM

## 2019-02-21 DIAGNOSIS — Z9889 Other specified postprocedural states: Secondary | ICD-10-CM

## 2019-02-21 DIAGNOSIS — I509 Heart failure, unspecified: Secondary | ICD-10-CM | POA: Diagnosis present

## 2019-02-21 DIAGNOSIS — Z95828 Presence of other vascular implants and grafts: Secondary | ICD-10-CM

## 2019-02-21 DIAGNOSIS — I7409 Other arterial embolism and thrombosis of abdominal aorta: Secondary | ICD-10-CM | POA: Diagnosis present

## 2019-02-21 DIAGNOSIS — Z86010 Personal history of colonic polyps: Secondary | ICD-10-CM

## 2019-02-21 DIAGNOSIS — Y92234 Operating room of hospital as the place of occurrence of the external cause: Secondary | ICD-10-CM | POA: Diagnosis not present

## 2019-02-21 DIAGNOSIS — Z823 Family history of stroke: Secondary | ICD-10-CM

## 2019-02-21 DIAGNOSIS — D62 Acute posthemorrhagic anemia: Secondary | ICD-10-CM | POA: Diagnosis not present

## 2019-02-21 DIAGNOSIS — Z8541 Personal history of malignant neoplasm of cervix uteri: Secondary | ICD-10-CM

## 2019-02-21 DIAGNOSIS — Z86718 Personal history of other venous thrombosis and embolism: Secondary | ICD-10-CM

## 2019-02-21 DIAGNOSIS — I70221 Atherosclerosis of native arteries of extremities with rest pain, right leg: Secondary | ICD-10-CM

## 2019-02-21 DIAGNOSIS — Z66 Do not resuscitate: Secondary | ICD-10-CM | POA: Diagnosis present

## 2019-02-21 DIAGNOSIS — S358X8A Other specified injury of other blood vessels at abdomen, lower back and pelvis level, initial encounter: Secondary | ICD-10-CM | POA: Diagnosis not present

## 2019-02-21 DIAGNOSIS — Z79899 Other long term (current) drug therapy: Secondary | ICD-10-CM

## 2019-02-21 DIAGNOSIS — Z8774 Personal history of (corrected) congenital malformations of heart and circulatory system: Secondary | ICD-10-CM

## 2019-02-21 DIAGNOSIS — Z8673 Personal history of transient ischemic attack (TIA), and cerebral infarction without residual deficits: Secondary | ICD-10-CM

## 2019-02-21 DIAGNOSIS — E162 Hypoglycemia, unspecified: Secondary | ICD-10-CM | POA: Diagnosis not present

## 2019-02-21 DIAGNOSIS — I97618 Postprocedural hemorrhage and hematoma of a circulatory system organ or structure following other circulatory system procedure: Secondary | ICD-10-CM | POA: Diagnosis not present

## 2019-02-21 DIAGNOSIS — I361 Nonrheumatic tricuspid (valve) insufficiency: Secondary | ICD-10-CM | POA: Diagnosis not present

## 2019-02-21 DIAGNOSIS — R34 Anuria and oliguria: Secondary | ICD-10-CM | POA: Diagnosis not present

## 2019-02-21 DIAGNOSIS — I959 Hypotension, unspecified: Secondary | ICD-10-CM | POA: Diagnosis not present

## 2019-02-21 DIAGNOSIS — I70222 Atherosclerosis of native arteries of extremities with rest pain, left leg: Secondary | ICD-10-CM | POA: Diagnosis not present

## 2019-02-21 DIAGNOSIS — E875 Hyperkalemia: Secondary | ICD-10-CM | POA: Diagnosis present

## 2019-02-21 HISTORY — PX: AORTIC ENDARTERECETOMY: SHX5724

## 2019-02-21 HISTORY — PX: AORTA - BILATERAL FEMORAL ARTERY BYPASS GRAFT: SHX1175

## 2019-02-21 LAB — URINALYSIS, ROUTINE W REFLEX MICROSCOPIC
Bilirubin Urine: NEGATIVE
Bilirubin Urine: NEGATIVE
Glucose, UA: NEGATIVE mg/dL
Glucose, UA: 50 mg/dL — AB
Hgb urine dipstick: NEGATIVE
Ketones, ur: NEGATIVE mg/dL
Ketones, ur: NEGATIVE mg/dL
Leukocytes,Ua: NEGATIVE
Leukocytes,Ua: NEGATIVE
Nitrite: NEGATIVE
Nitrite: NEGATIVE
Protein, ur: NEGATIVE mg/dL
Protein, ur: 100 mg/dL — AB
Specific Gravity, Urine: 1.004 — ABNORMAL LOW (ref 1.005–1.030)
Specific Gravity, Urine: 1.009 (ref 1.005–1.030)
pH: 5 (ref 5.0–8.0)
pH: 7 (ref 5.0–8.0)

## 2019-02-21 LAB — DIC (DISSEMINATED INTRAVASCULAR COAGULATION)PANEL
D-Dimer, Quant: 10.31 ug/mL-FEU — ABNORMAL HIGH (ref 0.00–0.50)
Fibrinogen: 278 mg/dL (ref 210–475)
INR: 1.6 — ABNORMAL HIGH (ref 0.8–1.2)
Platelets: 134 10*3/uL — ABNORMAL LOW (ref 150–400)
Platelets: 141 10*3/uL — ABNORMAL LOW (ref 150–400)
Prothrombin Time: 18.9 seconds — ABNORMAL HIGH (ref 11.4–15.2)
Smear Review: NONE SEEN
Smear Review: NONE SEEN
aPTT: 42 seconds — ABNORMAL HIGH (ref 24–36)

## 2019-02-21 LAB — POCT I-STAT 7, (LYTES, BLD GAS, ICA,H+H)
Acid-Base Excess: 1 mmol/L (ref 0.0–2.0)
Acid-base deficit: 5 mmol/L — ABNORMAL HIGH (ref 0.0–2.0)
Acid-base deficit: 19 mmol/L — ABNORMAL HIGH (ref 0.0–2.0)
Acid-base deficit: 5 mmol/L — ABNORMAL HIGH (ref 0.0–2.0)
Acid-base deficit: 7 mmol/L — ABNORMAL HIGH (ref 0.0–2.0)
Acid-base deficit: 4 mmol/L — ABNORMAL HIGH (ref 0.0–2.0)
Bicarbonate: 22.8 mmol/L (ref 20.0–28.0)
Bicarbonate: 12.7 mmol/L — ABNORMAL LOW (ref 20.0–28.0)
Bicarbonate: 19.2 mmol/L — ABNORMAL LOW (ref 20.0–28.0)
Bicarbonate: 27.5 mmol/L (ref 20.0–28.0)
Bicarbonate: 20 mmol/L (ref 20.0–28.0)
Bicarbonate: 21.9 mmol/L (ref 20.0–28.0)
Bicarbonate: 24.9 mmol/L (ref 20.0–28.0)
Calcium, Ion: 1.25 mmol/L (ref 1.15–1.40)
Calcium, Ion: 0.49 mmol/L — CL (ref 1.15–1.40)
Calcium, Ion: 1.08 mmol/L — ABNORMAL LOW (ref 1.15–1.40)
Calcium, Ion: 1.08 mmol/L — ABNORMAL LOW (ref 1.15–1.40)
Calcium, Ion: 1.05 mmol/L — ABNORMAL LOW (ref 1.15–1.40)
Calcium, Ion: 1.38 mmol/L (ref 1.15–1.40)
Calcium, Ion: 1.19 mmol/L (ref 1.15–1.40)
HCT: 37 % (ref 36.0–46.0)
HCT: 42 % (ref 36.0–46.0)
HCT: 29 % — ABNORMAL LOW (ref 36.0–46.0)
HCT: 30 % — ABNORMAL LOW (ref 36.0–46.0)
HCT: 19 % — ABNORMAL LOW (ref 36.0–46.0)
HCT: 27 % — ABNORMAL LOW (ref 36.0–46.0)
HCT: 19 % — ABNORMAL LOW (ref 36.0–46.0)
Hemoglobin: 12.6 g/dL (ref 12.0–15.0)
Hemoglobin: 14.3 g/dL (ref 12.0–15.0)
Hemoglobin: 10.2 g/dL — ABNORMAL LOW (ref 12.0–15.0)
Hemoglobin: 9.9 g/dL — ABNORMAL LOW (ref 12.0–15.0)
Hemoglobin: 6.5 g/dL — CL (ref 12.0–15.0)
Hemoglobin: 9.2 g/dL — ABNORMAL LOW (ref 12.0–15.0)
Hemoglobin: 6.5 g/dL — CL (ref 12.0–15.0)
O2 Saturation: 98 %
O2 Saturation: 100 %
O2 Saturation: 100 %
O2 Saturation: 99 %
O2 Saturation: 100 %
O2 Saturation: 99 %
O2 Saturation: 97 %
Patient temperature: 35.7
Patient temperature: 36.4
Patient temperature: 36.5
Patient temperature: 34
Patient temperature: 34.6
Patient temperature: 97.7
Potassium: 4.2 mmol/L (ref 3.5–5.1)
Potassium: 7.6 mmol/L (ref 3.5–5.1)
Potassium: 4.9 mmol/L (ref 3.5–5.1)
Potassium: 5.8 mmol/L — ABNORMAL HIGH (ref 3.5–5.1)
Potassium: 4 mmol/L (ref 3.5–5.1)
Potassium: 3.5 mmol/L (ref 3.5–5.1)
Potassium: 4.7 mmol/L (ref 3.5–5.1)
Sodium: 139 mmol/L (ref 135–145)
Sodium: 134 mmol/L — ABNORMAL LOW (ref 135–145)
Sodium: 141 mmol/L (ref 135–145)
Sodium: 142 mmol/L (ref 135–145)
Sodium: 145 mmol/L (ref 135–145)
Sodium: 148 mmol/L — ABNORMAL HIGH (ref 135–145)
Sodium: 148 mmol/L — ABNORMAL HIGH (ref 135–145)
TCO2: 24 mmol/L (ref 22–32)
TCO2: 14 mmol/L — ABNORMAL LOW (ref 22–32)
TCO2: 20 mmol/L — ABNORMAL LOW (ref 22–32)
TCO2: 29 mmol/L (ref 22–32)
TCO2: 21 mmol/L — ABNORMAL LOW (ref 22–32)
TCO2: 23 mmol/L (ref 22–32)
TCO2: 26 mmol/L (ref 22–32)
pCO2 arterial: 51.5 mmHg — ABNORMAL HIGH (ref 32.0–48.0)
pCO2 arterial: 52.1 mmHg — ABNORMAL HIGH (ref 32.0–48.0)
pCO2 arterial: 32.4 mmHg (ref 32.0–48.0)
pCO2 arterial: 51.3 mmHg — ABNORMAL HIGH (ref 32.0–48.0)
pCO2 arterial: 40.8 mmHg (ref 32.0–48.0)
pCO2 arterial: 38.4 mmHg (ref 32.0–48.0)
pCO2 arterial: 41.7 mmHg (ref 32.0–48.0)
pH, Arterial: 7.247 — ABNORMAL LOW (ref 7.350–7.450)
pH, Arterial: 6.995 — CL (ref 7.350–7.450)
pH, Arterial: 7.334 — ABNORMAL LOW (ref 7.350–7.450)
pH, Arterial: 7.378 (ref 7.350–7.450)
pH, Arterial: 7.282 — ABNORMAL LOW (ref 7.350–7.450)
pH, Arterial: 7.351 (ref 7.350–7.450)
pH, Arterial: 7.382 (ref 7.350–7.450)
pO2, Arterial: 124 mmHg — ABNORMAL HIGH (ref 83.0–108.0)
pO2, Arterial: 270 mmHg — ABNORMAL HIGH (ref 83.0–108.0)
pO2, Arterial: 123 mmHg — ABNORMAL HIGH (ref 83.0–108.0)
pO2, Arterial: 183 mmHg — ABNORMAL HIGH (ref 83.0–108.0)
pO2, Arterial: 210 mmHg — ABNORMAL HIGH (ref 83.0–108.0)
pO2, Arterial: 160 mmHg — ABNORMAL HIGH (ref 83.0–108.0)
pO2, Arterial: 86 mmHg (ref 83.0–108.0)

## 2019-02-21 LAB — CBC
HCT: 32.3 % — ABNORMAL LOW (ref 36.0–46.0)
HCT: 22.8 % — ABNORMAL LOW (ref 36.0–46.0)
Hemoglobin: 11.3 g/dL — ABNORMAL LOW (ref 12.0–15.0)
Hemoglobin: 7.9 g/dL — ABNORMAL LOW (ref 12.0–15.0)
MCH: 32.7 pg (ref 26.0–34.0)
MCH: 32 pg (ref 26.0–34.0)
MCHC: 35 g/dL (ref 30.0–36.0)
MCHC: 34.6 g/dL (ref 30.0–36.0)
MCV: 93.4 fL (ref 80.0–100.0)
MCV: 92.3 fL (ref 80.0–100.0)
Platelets: 141 10*3/uL — ABNORMAL LOW (ref 150–400)
Platelets: 122 10*3/uL — ABNORMAL LOW (ref 150–400)
RBC: 3.46 MIL/uL — ABNORMAL LOW (ref 3.87–5.11)
RBC: 2.47 MIL/uL — ABNORMAL LOW (ref 3.87–5.11)
RDW: 14.7 % (ref 11.5–15.5)
RDW: 15.3 % (ref 11.5–15.5)
WBC: 16.4 10*3/uL — ABNORMAL HIGH (ref 4.0–10.5)
WBC: 14.2 10*3/uL — ABNORMAL HIGH (ref 4.0–10.5)
nRBC: 0.2 % (ref 0.0–0.2)
nRBC: 0 % (ref 0.0–0.2)

## 2019-02-21 LAB — POCT I-STAT 4, (NA,K, GLUC, HGB,HCT)
Glucose, Bld: 675 mg/dL (ref 70–99)
Glucose, Bld: 227 mg/dL — ABNORMAL HIGH (ref 70–99)
HCT: 30 % — ABNORMAL LOW (ref 36.0–46.0)
HCT: 19 % — ABNORMAL LOW (ref 36.0–46.0)
Hemoglobin: 10.2 g/dL — ABNORMAL LOW (ref 12.0–15.0)
Hemoglobin: 6.5 g/dL — CL (ref 12.0–15.0)
Potassium: 4.8 mmol/L (ref 3.5–5.1)
Potassium: 3.5 mmol/L (ref 3.5–5.1)
Sodium: 142 mmol/L (ref 135–145)
Sodium: 147 mmol/L — ABNORMAL HIGH (ref 135–145)

## 2019-02-21 LAB — GLUCOSE, CAPILLARY
Glucose-Capillary: 209 mg/dL — ABNORMAL HIGH (ref 70–99)
Glucose-Capillary: 195 mg/dL — ABNORMAL HIGH (ref 70–99)
Glucose-Capillary: 169 mg/dL — ABNORMAL HIGH (ref 70–99)

## 2019-02-21 LAB — HEMOGLOBIN A1C
Hgb A1c MFr Bld: 5.3 % (ref 4.8–5.6)
Mean Plasma Glucose: 105.41 mg/dL

## 2019-02-21 LAB — POCT ACTIVATED CLOTTING TIME
Activated Clotting Time: 213 seconds
Activated Clotting Time: 235 seconds
Activated Clotting Time: 202 seconds
Activated Clotting Time: 494 seconds
Activated Clotting Time: 224 seconds
Activated Clotting Time: 81 seconds

## 2019-02-21 LAB — RENAL FUNCTION PANEL
Albumin: 2.5 g/dL — ABNORMAL LOW (ref 3.5–5.0)
Anion gap: 16 — ABNORMAL HIGH (ref 5–15)
BUN: 6 mg/dL (ref 6–20)
CO2: 25 mmol/L (ref 22–32)
Calcium: 9.1 mg/dL (ref 8.9–10.3)
Chloride: 107 mmol/L (ref 98–111)
Creatinine, Ser: 1.43 mg/dL — ABNORMAL HIGH (ref 0.44–1.00)
GFR calc Af Amer: 47 mL/min — ABNORMAL LOW (ref >60)
GFR calc non Af Amer: 40 mL/min — ABNORMAL LOW (ref >60)
Glucose, Bld: 214 mg/dL — ABNORMAL HIGH (ref 70–99)
Phosphorus: 5.2 mg/dL — ABNORMAL HIGH (ref 2.5–4.6)
Potassium: 3.4 mmol/L — ABNORMAL LOW (ref 3.5–5.1)
Sodium: 148 mmol/L — ABNORMAL HIGH (ref 135–145)

## 2019-02-21 LAB — LACTIC ACID, PLASMA
Lactic Acid, Venous: 7.5 mmol/L (ref 0.5–1.9)
Lactic Acid, Venous: 6.1 mmol/L (ref 0.5–1.9)

## 2019-02-21 LAB — CREATININE, SERUM
Creatinine, Ser: 1.32 mg/dL — ABNORMAL HIGH (ref 0.44–1.00)
GFR calc Af Amer: 51 mL/min — ABNORMAL LOW
GFR calc non Af Amer: 44 mL/min — ABNORMAL LOW

## 2019-02-21 LAB — PREPARE RBC (CROSSMATCH)

## 2019-02-21 SURGERY — CREATION, BYPASS, ARTERIAL, AORTA TO FEMORAL, BILATERAL, USING GRAFT
Anesthesia: General | Site: Abdomen

## 2019-02-21 MED ORDER — SODIUM CHLORIDE 0.9 % IV SOLN
INTRAVENOUS | Status: DC | PRN
  Administered 2019-02-21: 500 mL

## 2019-02-21 MED ORDER — 0.9 % SODIUM CHLORIDE (POUR BTL) OPTIME
TOPICAL | Status: DC | PRN
  Administered 2019-02-21: 4000 mL

## 2019-02-21 MED ORDER — SODIUM CHLORIDE 0.9 % IV SOLN
INTRAVENOUS | Status: DC | PRN
  Administered 2019-02-21: 08:00:00 25 ug/min via INTRAVENOUS

## 2019-02-21 MED ORDER — LACTATED RINGERS IV SOLN
INTRAVENOUS | Status: DC | PRN
  Administered 2019-02-21: 06:00:00 via INTRAVENOUS

## 2019-02-21 MED ORDER — SODIUM BICARBONATE 8.4 % IV SOLN
INTRAVENOUS | Status: DC | PRN
  Administered 2019-02-21: 100 meq via INTRAVENOUS
  Administered 2019-02-21: 50 meq via INTRAVENOUS
  Administered 2019-02-21: 100 meq via INTRAVENOUS
  Administered 2019-02-21 (×2): 50 meq via INTRAVENOUS

## 2019-02-21 MED ORDER — INSULIN ASPART 100 UNIT/ML ~~LOC~~ SOLN
SUBCUTANEOUS | Status: DC | PRN
  Administered 2019-02-21: 10 [IU] via INTRAVENOUS

## 2019-02-21 MED ORDER — FENTANYL CITRATE (PF) 250 MCG/5ML IJ SOLN
INTRAMUSCULAR | Status: AC
  Filled 2019-02-21: qty 5

## 2019-02-21 MED ORDER — MIDAZOLAM HCL 2 MG/2ML IJ SOLN
INTRAMUSCULAR | Status: DC | PRN
  Administered 2019-02-21: 2 mg via INTRAVENOUS

## 2019-02-21 MED ORDER — PROTAMINE SULFATE 10 MG/ML IV SOLN
INTRAVENOUS | Status: AC
  Filled 2019-02-21: qty 5

## 2019-02-21 MED ORDER — DEXAMETHASONE SODIUM PHOSPHATE 10 MG/ML IJ SOLN
INTRAMUSCULAR | Status: DC | PRN
  Administered 2019-02-21: 10 mg via INTRAVENOUS

## 2019-02-21 MED ORDER — SENNOSIDES-DOCUSATE SODIUM 8.6-50 MG PO TABS: 1.0000 | ORAL_TABLET | Freq: Every evening | ORAL | Status: DC | PRN

## 2019-02-21 MED ORDER — CHLORHEXIDINE GLUCONATE CLOTH 2 % EX PADS: 6.0000 | MEDICATED_PAD | Freq: Once | CUTANEOUS | Status: DC

## 2019-02-21 MED ORDER — ONDANSETRON HCL 4 MG/2ML IJ SOLN: 4.0000 mg | Freq: Four times a day (QID) | INTRAMUSCULAR | Status: DC | PRN

## 2019-02-21 MED ORDER — GUAIFENESIN-DM 100-10 MG/5ML PO SYRP: 15.0000 mL | ORAL_SOLUTION | ORAL | Status: DC | PRN

## 2019-02-21 MED ORDER — DEXTROSE 50 % IV SOLN
INTRAVENOUS | Status: DC | PRN
  Administered 2019-02-21: 50 mL via INTRAVENOUS

## 2019-02-21 MED ORDER — LACTATED RINGERS IV SOLN
INTRAVENOUS | Status: DC | PRN
  Administered 2019-02-21: 07:00:00 via INTRAVENOUS

## 2019-02-21 MED ORDER — MANNITOL 25 % IV SOLN
INTRAVENOUS | Status: DC | PRN
  Administered 2019-02-21: 25 g via INTRAVENOUS

## 2019-02-21 MED ORDER — SODIUM CHLORIDE 0.9 % IV SOLN
INTRAVENOUS | Status: AC
  Filled 2019-02-21: qty 1.2

## 2019-02-21 MED ORDER — LACTATED RINGERS IV SOLN
INTRAVENOUS | Status: AC
  Administered 2019-02-21 – 2019-02-22 (×3): via INTRAVENOUS

## 2019-02-21 MED ORDER — ONDANSETRON HCL 4 MG/2ML IJ SOLN
INTRAMUSCULAR | Status: AC
  Filled 2019-02-21: qty 2

## 2019-02-21 MED ORDER — ALBUMIN HUMAN 5 % IV SOLN
INTRAVENOUS | Status: DC | PRN
  Administered 2019-02-21 (×7): via INTRAVENOUS

## 2019-02-21 MED ORDER — METOPROLOL TARTRATE 5 MG/5ML IV SOLN: 2.0000 mg | INTRAVENOUS | Status: DC | PRN

## 2019-02-21 MED ORDER — PROPOFOL 1000 MG/100ML IV EMUL
0.0000 ug/kg/min | INTRAVENOUS | Status: DC
  Administered 2019-02-21: 22:00:00 50 ug/kg/min via INTRAVENOUS
  Filled 2019-02-21 (×2): qty 100

## 2019-02-21 MED ORDER — LIDOCAINE 2% (20 MG/ML) 5 ML SYRINGE
INTRAMUSCULAR | Status: DC | PRN
  Administered 2019-02-21: 40 mg via INTRAVENOUS

## 2019-02-21 MED ORDER — HEPARIN SODIUM (PORCINE) 1000 UNIT/ML IJ SOLN
INTRAMUSCULAR | Status: AC
  Filled 2019-02-21: qty 1

## 2019-02-21 MED ORDER — SODIUM CHLORIDE 0.9 % IV BOLUS
1000.0000 mL | Freq: Once | INTRAVENOUS | Status: AC
  Administered 2019-02-21: 1000 mL via INTRAVENOUS

## 2019-02-21 MED ORDER — FENTANYL CITRATE (PF) 250 MCG/5ML IJ SOLN
INTRAMUSCULAR | Status: DC | PRN
  Administered 2019-02-21 (×2): 50 ug via INTRAVENOUS
  Administered 2019-02-21: 100 ug via INTRAVENOUS
  Administered 2019-02-21: 50 ug via INTRAVENOUS
  Administered 2019-02-21: 100 ug via INTRAVENOUS
  Administered 2019-02-21: 50 ug via INTRAVENOUS
  Administered 2019-02-21: 100 ug via INTRAVENOUS
  Administered 2019-02-21: 50 ug via INTRAVENOUS

## 2019-02-21 MED ORDER — PHENYLEPHRINE HCL (PRESSORS) 10 MG/ML IV SOLN
INTRAVENOUS | Status: DC | PRN
  Administered 2019-02-21 (×2): 120 ug via INTRAVENOUS
  Administered 2019-02-21: 80 ug via INTRAVENOUS
  Administered 2019-02-21: 120 ug via INTRAVENOUS
  Administered 2019-02-21: 160 ug via INTRAVENOUS

## 2019-02-21 MED ORDER — BISACODYL 10 MG RE SUPP: 10.0000 mg | Freq: Every day | RECTAL | Status: DC | PRN

## 2019-02-21 MED ORDER — FENTANYL CITRATE (PF) 100 MCG/2ML IJ SOLN
50.0000 ug | INTRAMUSCULAR | Status: DC | PRN
  Administered 2019-02-22: 13:00:00 50 ug via INTRAVENOUS
  Filled 2019-02-21: qty 2

## 2019-02-21 MED ORDER — HEPARIN SODIUM (PORCINE) 1000 UNIT/ML IJ SOLN
INTRAMUSCULAR | Status: DC | PRN
  Administered 2019-02-21: 5000 [IU] via INTRAVENOUS
  Administered 2019-02-21 (×2): 2000 [IU] via INTRAVENOUS
  Administered 2019-02-21: 3000 [IU] via INTRAVENOUS

## 2019-02-21 MED ORDER — DOCUSATE SODIUM 100 MG PO CAPS: 100.0000 mg | ORAL_CAPSULE | Freq: Every day | ORAL | Status: DC

## 2019-02-21 MED ORDER — CLEVIDIPINE BUTYRATE 0.5 MG/ML IV EMUL
1.0000 mg/h | INTRAVENOUS | Status: DC
  Administered 2019-02-21: 09:00:00 2 mg/h via INTRAVENOUS
  Administered 2019-02-21: 09:00:00 3 mg/h via INTRAVENOUS
  Filled 2019-02-21: qty 50

## 2019-02-21 MED ORDER — PHENOL 1.4 % MT LIQD: 1.0000 | OROMUCOSAL | Status: DC | PRN

## 2019-02-21 MED ORDER — KCL IN DEXTROSE-NACL 20-5-0.45 MEQ/L-%-% IV SOLN
INTRAVENOUS | Status: DC
  Administered 2019-02-21 – 2019-02-22 (×3): via INTRAVENOUS
  Filled 2019-02-21 (×3): qty 1000

## 2019-02-21 MED ORDER — HEMOSTATIC AGENTS (NO CHARGE) OPTIME
TOPICAL | Status: DC | PRN
  Administered 2019-02-21: 2 via TOPICAL
  Administered 2019-02-21: 5 via TOPICAL

## 2019-02-21 MED ORDER — SODIUM CHLORIDE 0.9 % IV SOLN
500.0000 mL | Freq: Once | INTRAVENOUS | Status: AC | PRN
  Administered 2019-02-23: 12:00:00 via INTRAVENOUS

## 2019-02-21 MED ORDER — CEFAZOLIN SODIUM 1 G IJ SOLR
INTRAMUSCULAR | Status: AC
  Filled 2019-02-21: qty 20

## 2019-02-21 MED ORDER — PANTOPRAZOLE SODIUM 40 MG IV SOLR
40.0000 mg | INTRAVENOUS | Status: DC
  Administered 2019-02-21 – 2019-02-23 (×3): 40 mg via INTRAVENOUS
  Filled 2019-02-21 (×4): qty 40

## 2019-02-21 MED ORDER — LABETALOL HCL 5 MG/ML IV SOLN: 10.0000 mg | INTRAVENOUS | Status: DC | PRN

## 2019-02-21 MED ORDER — ACETAMINOPHEN 325 MG PO TABS: 325.0000 mg | ORAL_TABLET | ORAL | Status: DC | PRN

## 2019-02-21 MED ORDER — POTASSIUM CHLORIDE 20 MEQ PO PACK
40.0000 meq | PACK | Freq: Two times a day (BID) | ORAL | Status: DC
  Administered 2019-02-21: 21:00:00 40 meq via NASOGASTRIC
  Filled 2019-02-21: qty 2

## 2019-02-21 MED ORDER — MAGNESIUM SULFATE 2 GM/50ML IV SOLN: 2.0000 g | Freq: Every day | INTRAVENOUS | Status: DC | PRN

## 2019-02-21 MED ORDER — MIDAZOLAM HCL 2 MG/2ML IJ SOLN
INTRAMUSCULAR | Status: AC
  Filled 2019-02-21: qty 2

## 2019-02-21 MED ORDER — ORAL CARE MOUTH RINSE
15.0000 mL | OROMUCOSAL | Status: DC
  Administered 2019-02-21 – 2019-02-24 (×29): 15 mL via OROMUCOSAL

## 2019-02-21 MED ORDER — CHLORHEXIDINE GLUCONATE 0.12% ORAL RINSE (MEDLINE KIT)
15.0000 mL | Freq: Two times a day (BID) | OROMUCOSAL | Status: DC
  Administered 2019-02-21 – 2019-02-24 (×6): 15 mL via OROMUCOSAL

## 2019-02-21 MED ORDER — INSULIN ASPART 100 UNIT/ML ~~LOC~~ SOLN
0.0000 [IU] | SUBCUTANEOUS | Status: DC
  Administered 2019-02-21 (×2): 3 [IU] via SUBCUTANEOUS
  Administered 2019-02-21: 5 [IU] via SUBCUTANEOUS
  Administered 2019-02-22 (×2): 3 [IU] via SUBCUTANEOUS
  Administered 2019-02-22: 20:00:00 2 [IU] via SUBCUTANEOUS
  Administered 2019-02-22: 08:00:00 3 [IU] via SUBCUTANEOUS
  Administered 2019-02-22: 17:00:00 2 [IU] via SUBCUTANEOUS

## 2019-02-21 MED ORDER — CALCIUM CHLORIDE 10 % IV SOLN
INTRAVENOUS | Status: DC | PRN
  Administered 2019-02-21: 200 mg via INTRAVENOUS
  Administered 2019-02-21: 500 mg via INTRAVENOUS
  Administered 2019-02-21: 200 mg via INTRAVENOUS
  Administered 2019-02-21 (×3): 500 mg via INTRAVENOUS
  Administered 2019-02-21: 200 mg via INTRAVENOUS
  Administered 2019-02-21: 400 mg via INTRAVENOUS

## 2019-02-21 MED ORDER — ACETAMINOPHEN 325 MG RE SUPP: 325.0000 mg | RECTAL | Status: DC | PRN

## 2019-02-21 MED ORDER — ALUM & MAG HYDROXIDE-SIMETH 200-200-20 MG/5ML PO SUSP: 15.0000 mL | ORAL | Status: DC | PRN

## 2019-02-21 MED ORDER — PROPOFOL 10 MG/ML IV BOLUS
INTRAVENOUS | Status: DC | PRN
  Administered 2019-02-21: 140 mg via INTRAVENOUS
  Administered 2019-02-21: 30 mg via INTRAVENOUS

## 2019-02-21 MED ORDER — VASOPRESSIN 20 UNIT/ML IV SOLN
INTRAVENOUS | Status: AC
  Filled 2019-02-21: qty 1

## 2019-02-21 MED ORDER — SODIUM CHLORIDE 0.9 % IV SOLN
INTRAVENOUS | Status: DC | PRN
  Administered 2019-02-21: 11:00:00 via INTRAVENOUS

## 2019-02-21 MED ORDER — SODIUM BICARBONATE 8.4 % IV SOLN
INTRAVENOUS | Status: AC
  Filled 2019-02-21: qty 50

## 2019-02-21 MED ORDER — DEXAMETHASONE SODIUM PHOSPHATE 10 MG/ML IJ SOLN
INTRAMUSCULAR | Status: AC
  Filled 2019-02-21: qty 1

## 2019-02-21 MED ORDER — PANTOPRAZOLE SODIUM 40 MG PO TBEC: 40.0000 mg | DELAYED_RELEASE_TABLET | Freq: Every day | ORAL | Status: DC

## 2019-02-21 MED ORDER — HYDRALAZINE HCL 20 MG/ML IJ SOLN: 5.0000 mg | INTRAMUSCULAR | Status: DC | PRN

## 2019-02-21 MED ORDER — ROCURONIUM BROMIDE 50 MG/5ML IV SOSY
PREFILLED_SYRINGE | INTRAVENOUS | Status: DC | PRN
  Administered 2019-02-21: 50 mg via INTRAVENOUS
  Administered 2019-02-21: 30 mg via INTRAVENOUS
  Administered 2019-02-21: 50 mg via INTRAVENOUS
  Administered 2019-02-21: 30 mg via INTRAVENOUS

## 2019-02-21 MED ORDER — CEFAZOLIN SODIUM-DEXTROSE 2-4 GM/100ML-% IV SOLN
2.0000 g | Freq: Three times a day (TID) | INTRAVENOUS | Status: AC
  Administered 2019-02-21 – 2019-02-22 (×2): 2 g via INTRAVENOUS
  Filled 2019-02-21 (×2): qty 100

## 2019-02-21 MED ORDER — PROPOFOL 500 MG/50ML IV EMUL
INTRAVENOUS | Status: DC | PRN
  Administered 2019-02-21: 50 ug/kg/min via INTRAVENOUS

## 2019-02-21 MED ORDER — PROTAMINE SULFATE 10 MG/ML IV SOLN
INTRAVENOUS | Status: DC | PRN
  Administered 2019-02-21 (×2): 10 mg via INTRAVENOUS
  Administered 2019-02-21: 20 mg via INTRAVENOUS

## 2019-02-21 MED ORDER — PROPOFOL 10 MG/ML IV BOLUS
INTRAVENOUS | Status: AC
  Filled 2019-02-21: qty 20

## 2019-02-21 MED ORDER — MORPHINE SULFATE (PF) 2 MG/ML IV SOLN: 1.0000 mg | INTRAVENOUS | Status: DC | PRN

## 2019-02-21 MED ORDER — FUROSEMIDE 10 MG/ML IJ SOLN
INTRAMUSCULAR | Status: DC | PRN
  Administered 2019-02-21: 80 mg via INTRAMUSCULAR

## 2019-02-21 MED ORDER — VASOPRESSIN 20 UNIT/ML IV SOLN
INTRAVENOUS | Status: DC | PRN
  Administered 2019-02-21: 2 [IU] via INTRAVENOUS

## 2019-02-21 MED ORDER — FENTANYL CITRATE (PF) 100 MCG/2ML IJ SOLN
50.0000 ug | INTRAMUSCULAR | Status: DC | PRN
  Filled 2019-02-21: qty 2

## 2019-02-21 MED ORDER — SODIUM CHLORIDE 0.9 % IV SOLN
INTRAVENOUS | Status: DC
  Administered 2019-02-21: 11:00:00 via INTRAVENOUS

## 2019-02-21 MED ORDER — HEPARIN SODIUM (PORCINE) 5000 UNIT/ML IJ SOLN
5000.0000 [IU] | Freq: Three times a day (TID) | INTRAMUSCULAR | Status: DC
  Administered 2019-02-22 – 2019-02-24 (×5): 5000 [IU] via SUBCUTANEOUS
  Filled 2019-02-21 (×5): qty 1

## 2019-02-21 MED ORDER — NITROGLYCERIN 0.2 MG/ML ON CALL CATH LAB
INTRAVENOUS | Status: DC | PRN
  Administered 2019-02-21: 20 ug via INTRAVENOUS

## 2019-02-21 MED ORDER — CHLORHEXIDINE GLUCONATE CLOTH 2 % EX PADS
6.0000 | MEDICATED_PAD | Freq: Every day | CUTANEOUS | Status: DC
  Administered 2019-02-21 – 2019-02-22 (×2): 6 via TOPICAL

## 2019-02-21 MED ORDER — ONDANSETRON HCL 4 MG/2ML IJ SOLN
INTRAMUSCULAR | Status: DC | PRN
  Administered 2019-02-21: 4 mg via INTRAVENOUS

## 2019-02-21 MED ORDER — LACTATED RINGERS IV BOLUS
500.0000 mL | Freq: Once | INTRAVENOUS | Status: AC
  Administered 2019-02-21: 19:00:00 500 mL via INTRAVENOUS

## 2019-02-21 MED ORDER — POTASSIUM CHLORIDE CRYS ER 20 MEQ PO TBCR: 20.0000 meq | EXTENDED_RELEASE_TABLET | Freq: Every day | ORAL | Status: DC | PRN

## 2019-02-21 MED ORDER — NOREPINEPHRINE 4 MG/250ML-% IV SOLN
0.0000 ug/min | INTRAVENOUS | Status: DC
  Filled 2019-02-21: qty 250

## 2019-02-21 MED ORDER — DEXTROSE 50 % IV SOLN
INTRAVENOUS | Status: AC
  Filled 2019-02-21: qty 50

## 2019-02-21 MED ORDER — ARTIFICIAL TEARS OPHTHALMIC OINT
TOPICAL_OINTMENT | OPHTHALMIC | Status: AC
  Filled 2019-02-21: qty 3.5

## 2019-02-21 MED ORDER — SODIUM CHLORIDE 0.9 % IV BOLUS: 500.0000 mL | Freq: Once | INTRAVENOUS | Status: DC

## 2019-02-21 SURGICAL SUPPLY — 76 items
ADH SKN CLS APL DERMABOND .7 (GAUZE/BANDAGES/DRESSINGS) ×3
BAG ISL DRAPE 18X18 STRL (DRAPES) ×2
BAG ISOLATION DRAPE 18X18 (DRAPES) ×2 IMPLANT
CANISTER SUCT 3000ML PPV (MISCELLANEOUS) ×3 IMPLANT
CLIP VESOCCLUDE MED 24/CT (CLIP) ×5 IMPLANT
CLIP VESOCCLUDE SM WIDE 24/CT (CLIP) ×3 IMPLANT
COUNTER NEEDLE 20 DBL MAG RED (NEEDLE) ×2 IMPLANT
COVER WAND RF STERILE (DRAPES) ×3 IMPLANT
DERMABOND ADVANCED (GAUZE/BANDAGES/DRESSINGS) ×6
DERMABOND ADVANCED .7 DNX12 (GAUZE/BANDAGES/DRESSINGS) IMPLANT
DRAPE ISOLATION BAG 18X18 (DRAPES) ×4
ELECT BLADE 4.0 EZ CLEAN MEGAD (MISCELLANEOUS)
ELECT BLADE 6.5 EXT (BLADE) IMPLANT
ELECT REM PT RETURN 9FT ADLT (ELECTROSURGICAL) ×3
ELECTRODE BLDE 4.0 EZ CLN MEGD (MISCELLANEOUS) IMPLANT
ELECTRODE REM PT RTRN 9FT ADLT (ELECTROSURGICAL) ×1 IMPLANT
FELT TEFLON 1X6 (MISCELLANEOUS) IMPLANT
GLOVE BIO SURGEON STRL SZ 6.5 (GLOVE) ×3 IMPLANT
GLOVE BIO SURGEON STRL SZ7 (GLOVE) ×2 IMPLANT
GLOVE BIO SURGEON STRL SZ7.5 (GLOVE) ×5 IMPLANT
GLOVE BIO SURGEONS STRL SZ 6.5 (GLOVE) ×3
GLOVE BIOGEL PI IND STRL 6.5 (GLOVE) IMPLANT
GLOVE BIOGEL PI IND STRL 7.0 (GLOVE) IMPLANT
GLOVE BIOGEL PI IND STRL 7.5 (GLOVE) IMPLANT
GLOVE BIOGEL PI IND STRL 8 (GLOVE) ×1 IMPLANT
GLOVE BIOGEL PI INDICATOR 6.5 (GLOVE) ×10
GLOVE BIOGEL PI INDICATOR 7.0 (GLOVE) ×2
GLOVE BIOGEL PI INDICATOR 7.5 (GLOVE) ×4
GLOVE BIOGEL PI INDICATOR 8 (GLOVE) ×2
GLOVE ECLIPSE 6.5 STRL STRAW (GLOVE) ×4 IMPLANT
GLOVE ECLIPSE 7.0 STRL STRAW (GLOVE) ×2 IMPLANT
GLOVE SS BIOGEL STRL SZ 7.5 (GLOVE) IMPLANT
GLOVE SUPERSENSE BIOGEL SZ 7.5 (GLOVE) ×2
GLOVE SURG SS PI 6.5 STRL IVOR (GLOVE) ×4 IMPLANT
GOWN STRL REUS W/ TWL LRG LVL3 (GOWN DISPOSABLE) ×3 IMPLANT
GOWN STRL REUS W/ TWL XL LVL3 (GOWN DISPOSABLE) ×1 IMPLANT
GOWN STRL REUS W/TWL LRG LVL3 (GOWN DISPOSABLE) ×18
GOWN STRL REUS W/TWL XL LVL3 (GOWN DISPOSABLE) ×3
GRAFT HEMASHIELD 14X7MM (Vascular Products) ×2 IMPLANT
HANDLE SUCTION POOLE (INSTRUMENTS) IMPLANT
HEMOSTAT SNOW SURGICEL 2X4 (HEMOSTASIS) ×10 IMPLANT
INSERT FOGARTY 61MM (MISCELLANEOUS) ×3 IMPLANT
INSERT FOGARTY SM (MISCELLANEOUS) ×6 IMPLANT
KIT BASIN OR (CUSTOM PROCEDURE TRAY) ×3 IMPLANT
KIT TURNOVER KIT B (KITS) ×3 IMPLANT
LOOP VESSEL MAXI BLUE (MISCELLANEOUS) ×2 IMPLANT
NS IRRIG 1000ML POUR BTL (IV SOLUTION) ×9 IMPLANT
PACK AORTA (CUSTOM PROCEDURE TRAY) ×3 IMPLANT
PAD ARMBOARD 7.5X6 YLW CONV (MISCELLANEOUS) ×6 IMPLANT
RETAINER VISCERA MED (MISCELLANEOUS) ×3 IMPLANT
SPONGE LAP 18X18 RF (DISPOSABLE) ×8 IMPLANT
STAPLER VISISTAT 35W (STAPLE) IMPLANT
SUCTION POOLE HANDLE (INSTRUMENTS) ×3
SUT ETHIBOND 5 LR DA (SUTURE) IMPLANT
SUT MNCRL AB 4-0 PS2 18 (SUTURE) ×12 IMPLANT
SUT PDS AB 1 TP1 96 (SUTURE) ×6 IMPLANT
SUT PROLENE 3 0 SH1 36 (SUTURE) ×32 IMPLANT
SUT PROLENE 5 0 C 1 24 (SUTURE) ×12 IMPLANT
SUT PROLENE 5 0 C 1 36 (SUTURE) ×2 IMPLANT
SUT PROLENE 6 0 CC (SUTURE) ×4 IMPLANT
SUT SILK 2 0 (SUTURE) ×6
SUT SILK 2 0 SH CR/8 (SUTURE) ×5 IMPLANT
SUT SILK 2-0 18XBRD TIE 12 (SUTURE) ×1 IMPLANT
SUT SILK 3 0 (SUTURE) ×6
SUT SILK 3-0 18XBRD TIE 12 (SUTURE) ×1 IMPLANT
SUT SILK 4 0 (SUTURE) ×6
SUT SILK 4-0 18XBRD TIE 12 (SUTURE) ×1 IMPLANT
SUT VIC AB 2-0 CT1 27 (SUTURE) ×6
SUT VIC AB 2-0 CT1 TAPERPNT 27 (SUTURE) ×2 IMPLANT
SUT VIC AB 3-0 SH 27 (SUTURE) ×18
SUT VIC AB 3-0 SH 27X BRD (SUTURE) ×4 IMPLANT
TOWEL GREEN STERILE (TOWEL DISPOSABLE) ×3 IMPLANT
TOWEL NATURAL 6PK STERILE (DISPOSABLE) ×2 IMPLANT
TOWEL SURG RFD BLUE STRL DISP (DISPOSABLE) ×3 IMPLANT
TRAY FOLEY MTR SLVR 16FR STAT (SET/KITS/TRAYS/PACK) ×3 IMPLANT
WATER STERILE IRR 1000ML POUR (IV SOLUTION) ×6 IMPLANT

## 2019-02-21 NOTE — Anesthesia Procedure Notes (Signed)
Procedure Name: Intubation Date/Time: 03/03/2019 7:53 AM Performed by: Kathryne Hitch, CRNA Pre-anesthesia Checklist: Patient identified, Emergency Drugs available, Suction available and Patient being monitored Patient Re-evaluated:Patient Re-evaluated prior to induction Oxygen Delivery Method: Circle system utilized Preoxygenation: Pre-oxygenation with 100% oxygen Induction Type: IV induction Ventilation: Mask ventilation without difficulty Laryngoscope Size: Mac and 3 Grade View: Grade I Tube type: Oral Tube size: 7.0 mm Number of attempts: 1 Airway Equipment and Method: Stylet and Oral airway Placement Confirmation: ETT inserted through vocal cords under direct vision,  positive ETCO2 and breath sounds checked- equal and bilateral Secured at: 21 cm Tube secured with: Tape Dental Injury: Teeth and Oropharynx as per pre-operative assessment

## 2019-02-21 NOTE — Progress Notes (Addendum)
Awaiting setup for CVP

## 2019-02-21 NOTE — Transfer of Care (Signed)
Immediate Anesthesia Transfer of Care Note  Patient: Nira G Larsen  Procedure(s) Performed: AORTOBIFEMORAL BYPASS GRAFT Using Hemashield Graft (N/A Abdomen) Aortic Endarterecetomy (N/A Abdomen)  Patient Location: ICU  Anesthesia Type:General  Level of Consciousness: Patient remains intubated per anesthesia plan  Airway & Oxygen Therapy: Patient remains intubated per anesthesia plan and Patient placed on Ventilator (see vital sign flow sheet for setting)  Post-op Assessment: Report given to RN and Post -op Vital signs reviewed and stable  Post vital signs: Reviewed and stable  Last Vitals:  Vitals Value Taken Time  BP 160/88 02/16/2019 1500  Temp    Pulse 84 03/04/2019 1503  Resp 15 03/14/2019 1503  SpO2 100 % 02/20/2019 1503  Vitals shown include unvalidated device data.  Last Pain:  Vitals:   03/10/2019 0600  TempSrc:   PainSc: 0-No pain      Patients Stated Pain Goal: 8 (56/21/30 8657)  Complications: No apparent anesthesia complications

## 2019-02-21 NOTE — Anesthesia Procedure Notes (Signed)
Central Venous Catheter Insertion Performed by: Roberts Gaudy, MD, anesthesiologist Start/End08/18/2020 6:45 AM, 02/21/2019 6:55 AM Patient location: Pre-op. Preanesthetic checklist: patient identified, IV checked, site marked, risks and benefits discussed, surgical consent, monitors and equipment checked, pre-op evaluation, timeout performed and anesthesia consent Lidocaine 1% used for infiltration and patient sedated Hand hygiene performed  and maximum sterile barriers used  Catheter size: 8 Fr Total catheter length 16. Central line was placed.Double lumen Procedure performed using ultrasound guided technique. Ultrasound Notes:anatomy identified, needle tip was noted to be adjacent to the nerve/plexus identified, no ultrasound evidence of intravascular and/or intraneural injection and image(s) printed for medical record Attempts: 1 Following insertion, dressing applied and line sutured. Post procedure assessment: blood return through all ports  Patient tolerated the procedure well with no immediate complications.

## 2019-02-21 NOTE — Consult Note (Signed)
NAME:  Kristina Mayer, MRN:  [id number], DOB:  10/12/1960, LOS: 0 ADMISSION DATE:  03/08/2019, CONSULTATION DATE:  02/12/2019 REFERRING MD:  Dr. Carlis Abbott, Vascular Surgery, CHIEF COMPLAINT:  CLI of BLE and aortoiliac disease, S/p aortobifemoral bypass and aortic endarterectomy  Brief History   37F with PHMx of HTN, hyperlipidemia, CVA (2012), hx of PDA repair, CLI of BLE, aortoiliac occlusive disease, who is now s/p aortobifemoral bypass and aortic endarterectomy of infra-renal and supra-renal aorta (03/07/2019). Surgery complicated by EBL of 4008. Patient was resuscitated with 6u pRBCs, 4u FFP, 2u platelets, 2u cryoprecipitate, and 1100 mL Cell Saver. Transferred to ICU post-operatively, in stable condition, still intubated.  History of present illness   Kristina Mayer is a 58 year old woman with a PHMx significant for HTN, hyperlipidemia, CVA (2012), hx of PDA repair, tobacco use disorder, CLI of BLE, aortoiliac occlusive disease, who is now s/p aortobifemoral bypass and aortic endarterectomy of infra-renal and supra-renal aorta (02/18/2019). Surgery complicated by EBL of 6761. Patient was resuscitated with 6u pRBCs, 4u FFP, 2u platelets, 2u cryoprecipitate, and 1100 mL Cell Saver. Transferred to ICU post-operatively, in stable condition, still intubated.  Past Medical History  Hypertension Hyperlipidemia Chronic Limb Ischemia Aortoiliac occlusive disease Tobacco use disorder History of PDA repair  Significant Hospital Events   8/10: Aortobifemoral bypass and aortic endarterectomy    Consults:  Vascular Surgery 8/10 PCCM 8/10  Procedures:  8/10: Aortobifemoral bypass and aortic endarterectomy ETT 8/10 >>   Significant Diagnostic Tests:  CT Angiography (02/02/19): Advanced aortic atherosclerosis, with bulky calcified and soft plaque of the juxtarenal aorta/infrarenal aorta nearly filling the aortic lumen, and contributing to occlusion just above the IMA takeoff. 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 Bilateral left greater than right renal arterial disease with high-grade stenosis of the main left renal artery and associated renal cortical thinning. Bilateral iliac arterial disease with at least 50% narrowing at 2 locations of the right common iliac artery, and more mild left common iliac arterial disease.  Echo (02/07/19): EF 55-60% with some diastolic dysfunction noted  Lexiscan (02/07/19): Myocardial perfusion imaging normal, low risk study.  Micro Data:  SARS CoV 2 (02/17/19): Negative MRSA PCR (02/15/19): Negative   Antimicrobials:  Cefazolin (8/10) for surgical prophylaxis  Interim history/subjective:  Patient underwent aortic endarterectomy of infra-renal and supra-renal aorta in addition to aortobifemoral bypass. She was transferred to the ICU from the PACU, intubated and sedated, in stable condition. Surgery complicated by EBL of 9509TO. Patient was resuscitated with 6u pRBCs, 4u FFP, 2u platelets, 2u cryoprecipitate, and 1100 mL Cell Saver.   Objective   Blood pressure (!) 154/82, pulse 80, temperature 98 F (36.7 C), temperature source Oral, resp. rate 12, SpO2 100 %.    Vent Mode: SIMV;PRVC;PSV FiO2 (%):  [50 %] 50 % Set Rate:  [12 bmp] 12 bmp Vt Set:  [490 mL] 490 mL PEEP:  [5 cmH20] 5 cmH20 Pressure Support:  [10 cmH20] 10 cmH20 Plateau Pressure:  [16 cmH20] 16 cmH20   Intake/Output Summary (Last 24 hours) at 02/20/2019 1458 Last data filed at 02/25/2019 1415 Gross per 24 hour  Intake 10093 ml  Output 4055 ml  Net 6038 ml   There were no vitals filed for this visit.  Examination: General: Intubated, sedated HENT: ETT in place. NG tube in place. Pupils equal, constricted, reactive Neck: Supple. JVD difficult to assess.  Lungs: Clear to  anterior auscultation bilaterally. Equal breath sounds bilaterally.  Cardiovascular: Normal rate,  normal S1, S2, no murmurs appreciated. Dorsalis pedis pulse difficult to appreciate but per nursing Vascular Surgery had just obtained good Doppler signal over the dorsalis pedis artery. Abdomen: Soft, non-distended. Positive bowel sounds. Midline incision from xiphoid to just above the pubis, bilateral groin incisions below inguinal ligament noted; no evidence of active bleeding.  Extremities: No lower extremity edema. Shiny skin, loss of hair, and muscle atrophy noted on bilateral lower extremities consistent with arterial insufficiency. Neuro: Sedated, does not follow commands. GU: Foley catheter in place, draining pink-tinged urine.  Resolved Hospital Problem list     Assessment & Plan:  Kristina Mayer is a 58 year old woman with a PHMx significant for HTN, hyperlipidemia, CVA (2012), hx of PDA repair, tobacco use disorder, CLI of BLE, aortoiliac occlusive disease, who is now s/p aortobifemoral bypass and aortic endarterectomy of infra-renal and supra-renal aorta (03/14/2019). Surgery complicated by EBL of 7341. Patient was resuscitated with 6u pRBCs, 4u FFP, 2u platelets, 2u cryoprecipitate, and 1100 mL Cell Saver. Transferred to ICU post-operatively, in stable condition, still intubated.  Acute Respiratory Failure, in post-operative setting, tobacco use disorder, no known diagnosis of COPD Patient remains intubated s/p aortobifemoral bypass and aortic endarterectomy of infra-renal and supra-renal aorta. - Continue ventilatory support, wean settings as tolerated. - Sedation with propofol. - VAP precautions. - Monitor volume status, plan on diuresis prior to extubation.  CLI and aortoiliac disease, s/p aortic endarterectomy of infra-renal and supra-renal aorta (02/23/2019)  - S/p surgical intervention as above. - Management per Vascular Surgery. - Neurovascular checks every 4 hours. - Monitor for signs of reperfusion injury. - Follow up lactic acid level. - Follow up DIC panel.  Anemia Patient  with acute blood loss during surgery, resuscitation as above. Post-operative hemoglobin 11.3 s/p 4units PRBC. Pre-op Hgb was 12.9 on 02/15/19. - Trend hemoglobin. - Monitor for signs/symptoms of bleeding.  Renal Monitor for AKI. Post-operative Cr 1.43, from 0.88 pre-operatively. Foley with pink-tinged urine, will obtain UA. - Follow BMP. - Follow up UA.   Hypertension, Diastolic Dysfunction Patient with history of hypertension, recent echo on 02/07/19 that showed EF 55-60% with evidence of diastolic dysfunction. Pre-operative myocardial perfusion scan was normal. Recently started on metroprolol succinate 25mg  daily for hypertension and cardiac protection during outpatient Cardiology evaluation. S/p clevidipine gtt. - 1st line: labetalol IV prn for SBP > 160. - 2nd line: hydralazine IV for SBP > 160. - Metoprolol tartrate IV prn for sustained HR > 130.  Tobacco Use Disorder - Smoking cessation support and counseling.  Elevated Blood Glucose Patient with elevated blood glucose, but no known history of diabetes. Last Hgb A1c in 2015 was 5.5. - Obtain Hemoglobin A1c. - SSI.  Electrolytes -Replete potassium.  Best practice:  Diet: NPO Pain/Anxiety/Delirium protocol (if indicated): Yes VAP protocol (if indicated): Yes DVT prophylaxis: Holding now in immediate post-operative setting, planning to start heparin tomorrow (8/11) GI prophylaxis: Pantoprazole Glucose control: SSI Mobility: Bedrest Code Status: Full code Family Communication: Updated patient's daughter at bedside Disposition: ICU  Labs   CBC: Recent Labs  Lab 02/15/19 1450  WBC 10.5  HGB 12.9  HCT 38.7  MCV 100.0  PLT 937    Basic Metabolic Panel: Recent Labs  Lab 02/15/19 1450  NA 136  K 4.4  CL 105  CO2 20*  GLUCOSE 110*  BUN 5*  CREATININE 0.88  CALCIUM 8.6*   GFR: Estimated Creatinine Clearance: 45.9 mL/min (by C-G formula based on  SCr of 0.88 mg/dL). Recent Labs  Lab 02/15/19 1450  WBC 10.5     Liver Function Tests: Recent Labs  Lab 02/15/19 1450  AST 17  ALT 12  ALKPHOS 114  BILITOT 0.4  PROT 6.0*  ALBUMIN 3.4*   No results for input(s): LIPASE, AMYLASE in the last 168 hours. No results for input(s): AMMONIA in the last 168 hours.  ABG    Component Value Date/Time   PHART 7.437 02/15/2019 1439   PCO2ART 36.4 02/15/2019 1439   PO2ART 118 (H) 02/15/2019 1439   HCO3 24.1 02/15/2019 1439   O2SAT 98.9 02/15/2019 1439     Coagulation Profile: Recent Labs  Lab 02/15/19 1450  INR 1.0    Cardiac Enzymes: No results for input(s): CKTOTAL, CKMB, CKMBINDEX, TROPONINI in the last 168 hours.  HbA1C: Hgb A1c MFr Bld  Date/Time Value Ref Range Status  12/09/2013 10:27 AM 5.5 <5.7 % Final    Comment:                                                                          According to the ADA Clinical Practice Recommendations for 2011, whenHbA1c is used as a screening test:   >=6.5%   Diagnostic of Diabetes Mellitus           (if abnormal result is  confirmed) 5.7-6.4%   Increased risk of developing Diabetes Mellitus References:Diagnosis and Classification of Diabetes Mellitus,DiabetesCare,2011,34(Suppl 1):S62-S69 and Standards of Medical Care in        Diabetes - 2011,Diabetes QZES,9233,00 (Suppl  1):S11-S61.     CBG: No results for input(s): GLUCAP in the last 168 hours.  Review of Systems:   Unable to perform as patient is intubated and sedated.  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   Surgical History    Past Surgical History:  Procedure Laterality Date   ABDOMINAL HYSTERECTOMY     CARDIAC VALVE SURGERY     COLONOSCOPY     EYE SURGERY       Social History   reports that she has  been smoking cigarettes. She has a 20.00 pack-year smoking history. She has never used smokeless tobacco. She reports that she does not drink alcohol or use drugs.   Family History   Her family history includes Cancer in her mother; Colon cancer in her paternal aunt; Esophageal cancer in her maternal grandfather; Heart attack in her brother; Heart disease in her brother; Hypertension in her mother and sister; Stroke in her maternal uncle and mother. There is no history of Rectal cancer or Stomach cancer.   Allergies No Known Allergies   Home Medications  Prior to Admission medications   Medication Sig Start Date End Date Taking? Authorizing Provider  aspirin EC 325 MG tablet Take 325 mg by mouth daily.   Yes [provider]  atorvastatin (LIPITOR) 40 MG tablet Take 1 tablet (40 mg total) by mouth daily. 02/03/19 05/04/19 Yes Miquel Dunn, NP  butalbital-acetaminophen-caffeine (FIORICET) [phone] MG per tablet Take 1 tablet by  mouth 2 (two) times daily as needed for headache. 02/21/14  Yes Advani, Deepak, MD  clopidogrel (PLAVIX) 75 MG tablet Take 75 mg by mouth daily.    Yes [provider]  DULoxetine (CYMBALTA) 30 MG capsule Take 30 mg by mouth 2 (two) times daily.   Yes [provider]  lisinopril (PRINIVIL,ZESTRIL) 20 MG tablet Take 20 mg by mouth daily.   Yes [provider]  loratadine (CLARITIN) 10 MG tablet Take 1 tablet (10 mg total) by mouth daily. 01/23/14  Yes Advani, [place] Prey, MD  metoprolol succinate (TOPROL XL) 25 MG 24 hr tablet Take 1 tablet (25 mg total) by mouth daily. 02/03/19  Yes Miquel Dunn, NP  potassium chloride (MICRO-K) 10 MEQ CR capsule Take 10 mEq by mouth daily.   Yes [provider]  OVER THE COUNTER MEDICATION Apply 1 application topically daily. Theraworx topical cream    [provider]     Atilano Ina, MS4

## 2019-02-21 NOTE — H&P (Signed)
History and Physical Interval Note:  03/09/2019 7:18 AM  Kristina Mayer  has presented today for surgery, with the diagnosis of aortoiliac occlusive disease.  The various methods of treatment have been discussed with the patient and family. After consideration of risks, benefits and other options for treatment, the patient has consented to  Procedure(s): AORTOBIFEMORAL BYPASS GRAFT POSSIBLE MESENTERIC BYPASS (N/A) as a surgical intervention.  The patient's history has been reviewed, patient examined, no change in status, stable for surgery.  I have reviewed the patient's chart and labs.  Questions were answered to the patient's satisfaction.    Aortobifemoral bypass for CLI with rest pain in setting of occlusive disease.  ABI 0.34/0.39 bilaterally.  Large occlusive plaque extends above renal arteries.  Will require some aortic endarterectomy prior to sewing aortobifem.  Discussed high risk for renal and bowel ischemia given supra-renal vs supra-celiac clamp.  Risk for bowel ischemia and renal failure.    Marty Heck  Virtual Visit via Telephone Note   I connected with Nolen Mu on 02/08/2019 using the [email or web address] by telephoneand verified that I was speaking with the correct person using two identifiers.  The limitations of evaluation and management by telemedicine and the availability of in person appointments have been previously discussed with the patient and are documented in the patients chart. The patient expressed understanding and consented to proceed.  PCP: Boyce Medici, FNP  Chief Complaint: Bilateral leg pain, aortic occlusion  History of Present Illness: Kristina Mayer is a 58 y.o. female with with history of hypertension, hyperlipidemia, previous stroke as well as a remote CABG there was initially referred for bilateral leg pain.  She has had leg pain for 3 years initially described as claudication progressing since late last year now with some early rest pain  symptoms.  When I examined her last week she had no femoral pulses and no pedal pulses all consistent with aortic occlusion.  Ultimately I sent her for a CTA abdomen pelvis and lower extremity runoff.  She is on the schedule today for a phone call to follow-up after the CT scan.  She has also been referred to cardiology and is seeing Dr. Einar Gip with echocardiogram and possible stress test.  Reports her legs are stable on the phone.      Past Medical History:  Diagnosis Date  . Allergic rhinitis   . Allergy   . Blood transfusion   . Cervical cancer ([place])   . Chronic headaches   . Cyst    by left eye  . Hyperlipidemia   . Hypertension   . Stroke ([place]) 2012   . 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 ABDOMINAL HYSTERECTOMY    . CARDIAC VALVE SURGERY    . COLONOSCOPY      Active Medications      Current Meds  Medication Sig  . atorvastatin (LIPITOR) 40 MG tablet Take 1 tablet (40 mg total) by mouth daily.  . butalbital-acetaminophen-caffeine (FIORICET) 50-325-40 MG per tablet Take 1 tablet by mouth 2 (two) times daily as needed for headache.  . clopidogrel (PLAVIX) 75 MG tablet Take 75 mg by mouth 2 (two) times daily.  Marland Kitchen lisinopril (PRINIVIL,ZESTRIL) 20 MG tablet Take 20 mg by mouth daily.  Marland Kitchen loratadine (CLARITIN) 10 MG tablet Take 1 tablet (10 mg total) by mouth daily.  . metoprolol succinate (TOPROL XL) 25 MG 24 hr tablet Take  1 tablet (25 mg total) by mouth daily.  Marland Kitchen OVER THE COUNTER MEDICATION Apply 1 application topically daily. Theraworx topical cream  . potassium chloride (MICRO-K) 10 MEQ CR capsule Take 10 mEq by mouth daily.      12 system ROS was negative unless otherwise noted in HPI  Observations/Objective:  CTA 02/02/19:  Advanced aortic atherosclerosis, with bulky calcified and soft plaque of the juxtarenal aorta/infrarenal aorta nearly filling the aortic lumen, and contributing  to occlusion just above the IMA takeoff. Reconstitution of the distal aorta via collateral flow, primarily via enlarged Winslow pathway. Aortic Atherosclerosis (ICD10-I70.0).  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 IMA remains patent beyond the aortic occlusion, though is likely stenotic at the origin.  Bilateral left greater than right renal arterial disease with high-grade stenosis of the main left renal artery and associated renal cortical thinning.  Bilateral iliac arterial disease with at least 50% narrowing at 2 locations of the right common iliac artery, and more mild left common iliac arterial disease.  There is relatively minimal atherosclerotic changes of the bilateral external iliac arteries, femoropopliteal arteries, and tibial arteries. The bilateral anterior tibial arteries and posterior tibial arteries are patent to the ankles, with decreased contrast of the bilateral peroneal arteries distally, either secondary to distal embolization or potentially from late arrival of the contrast bolus.  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  Discussed with her in detail that I think she needs an aortobifemoral bypass and we will schedule this for August 10.  I went over all the risks and benefits and quoted her at least 30% risk of major complication including bleeding, bowel injury, ureter injury, require return to the OR, graft infection and or even death.  Discussed she be at high risk for limb loss with no intervention given the progression of her symptoms over the last year.  She has bulky calcific disease that extends up to her visceral segment including at  the level of the renal arteries as well as just below the SMA.  Discussed that we would have to potentially perform SMA bypass and/or reimplant the renal artery if needed.  Waiting on her cardiac work-up with Dr. Einar Gip to ensure that she can tolerate open aortic surgery given some history of open cardiac surgery in the past with no follow-up.  She needs to hold her Plavix 5-7 days before surgery.  Follow Up Instructions:  Plan for aortobifemoral bypass on 02/26/2019.  I discussed the assessment and treatment plan with the patient. The patient was provided an opportunity to ask questions and all were answered. The patient agreed with the plan and demonstrated an understanding of the instructions.  The patient was advised to call back or seek an in-person evaluation if the symptoms worsen or if the condition fails to improve as anticipated.  I spent 11 minutes with the patient via telephone encounter.   Signed, Marty Heck [facility] Office: [phone]  02/08/2019, 10:45 AM    Revision History

## 2019-02-21 NOTE — Anesthesia Procedure Notes (Signed)
Arterial Line Insertion Start/End08/30/2020 6:43 AM, 02/21/2019 6:44 AM Performed by: Kathryne Hitch, CRNA, CRNA  Patient location: Pre-op. Preanesthetic checklist: patient identified, IV checked, site marked, risks and benefits discussed, surgical consent, monitors and equipment checked, pre-op evaluation and timeout performed Lidocaine 1% used for infiltration Right, radial was placed Catheter size: 20 Fr Hand hygiene performed  and maximum sterile barriers used   Attempts: 1 Procedure performed without using ultrasound guided technique. Following insertion, dressing applied. Post procedure assessment: normal and unchanged  Additional procedure comments: Kaylyn Layer, SRNA placed aline. Marland Kitchen

## 2019-02-21 NOTE — Plan of Care (Signed)
  Problem: Health Behavior/Discharge Planning: Goal: Ability to manage health-related needs will improve Outcome: Not Progressing   

## 2019-02-21 NOTE — Progress Notes (Signed)
eLink Physician-Brief Progress Note Patient Name: CARLYLE ACHENBACH DOB: 02-Jul-1961 MRN: [id number]   Date of Service  03/08/2019  HPI/Events of Note  Hypotension - BP = 89/57. Hgb = 11.3 and CVP pending. Last LVEF = 55% - 60%.  eICU Interventions  Will order: 1. Bolus with 0.9 NaCl 1 liter IV over 1 hour now.  2. Await CVP.         Sommer,Steven Cornelia Copa 03/05/2019, 8:19 PM

## 2019-02-21 NOTE — Progress Notes (Signed)
Dr. Lynetta Mare notified of critical lab value: lactic acid 7.5. Pt BP also low at MAP's in the upper 50's to low 60's. Orders to give 500 cc LR bolus and change maintenance fluids to 125 ml/hr. Trend serial lactates. RN will continue to monitor.

## 2019-02-21 NOTE — Op Note (Signed)
Date: February 21, 2019  Preoperative diagnosis: Critical limb ischemia of the bilateral lower extremities with aortoiliac occlusive disease and bilateral lower extremity rest pain  Postoperative diagnosis: Same  Procedure: 1.  Aortic endarterectomy of the infra-renal and supra-renal aorta 2.  Aortobifemoral bypass (14 mm x 7 mm bifurcated Dacron graft)  Surgeon: Dr. Marty Heck, MD   Assistant: Dr. Curt Jews, MD and Roxy Horseman, [place]  Indications: Patient is a 58 year old female who was seen in consultation for bilateral lower extremity rest pain with ABIs in the 0.3 range.  Ultimately she had no femoral pulses and CT was obtained that showed abdominal aortic occlusion with extension of the occlusive disease up above the level of the renal arteries.  Ultimately I recommended aortobifemoral bypass with aortic endarterectomy.  Patient was referred to cardiology for cardiac clearance prior to the procedure.  She presents today after risks and benefits were discussed including risk of bleeding, infection, bowel injury, mesenteric ischemia, renal ischemia requiring dialysis, etc.  Findings: The left renal vein was ligated in order to get exposure of the perivisceral aorta and also dissected out both the left and right renal arteries.  Initially found a clamp site just below the superior mesenteric artery and the aorta was transected just below the renal arteries with a sewing cuff.  Endarterectomy was then performed up to the level of the renal arteries into the suprarenal aorta.  Unfortunately during this maneuver we encountered brisk bleeding from the perivisceral aorta and it was unclear whether this was a tear in the aorta or renal artery branch.  Ultimately had to place a supraceliac clamp in order to get better better visualization.  It appeared we had avulsed an accessory right renal artery above the native main right renal artery.  This was repaired with multiple 3-0 Prolene  pledgeted sutures.  Unfortunately the aorta started falling apart while attempting to repair the avulsed accessory renal artery.  We spent a significant amount of time repairing the aorta with additional pledgeted sutures until we can finally got hemostasis with 3-0 Prolenes and additional pledgets.  This required prolonged supra-celiac clamp.  Finally after this was repaired we moved our clamp to infra-renal aorta. A 14 mm by 7 mm bifurcated dacron graft was sewn onto the infrarenal aortic cuff with felt.  It was then tunneled under both ureters.  Was sewn to both groins in end to side fashion to the common femoral arteries.  DP and PT signals at completion of the base.  Brisk doppler signals in both renal arteries.  Palpable pulse in SMA.  Anesthesia: General  EBL: 3700 mL's  Resuscitation: 6 units packed red blood cells, 4 units FFP, 2 units platelets, 2 units cryo and 1100 mL Cell Saver  Details: Patient was taken to the operating room after informed consent was obtained.  She was placed on the operative table in supine position and both arms were tucked.  Her bilateral groins as well as her abdominal wall were prepped and draped in usual sterile fashion.  Prep timeout was performed to identify patient procedure and site.  Initially made vertical groin incisions just below the inguinal ligament in both groins.  Dissected down with Bovie cautery and blunt dissection and circumferentially controlled the common femoral arteries in both groins that appeared fairly disease-free with large Vesseloops.  I did not find any circumflex vein to ligate in either groin.  I tunneled above the distal external iliacs with my finger bluntly.  Then made a  midline abdominal incision from the xiphoid to just above the pubis.  I opened the midline fascia and entered the peritoneal cavity bluntly.  I explored all quadrants and did not see anything as far as masses or other abnormalities.  She did have some adhesions in her  upper midline that we took down bluntly with Metzenbaum scissors.  Ultimately the small bowel was then eviscerated into the right upper quadrant and I started by mobilizing the duodenum to the patient's right and entered the retroperitoneum which I opened with Bovie cautery.  The IMV was ligated between 2-0 silk ties and divided.  The IMA was identified and was controlled with a vessel loop I could identify both ureters.  I then placed fixed retractor with body walls on both sides as well as splanchnic retractor superiorly for added visualization.  Ultimately dissected up to the renal vein which was then circumferentially dissected and ultimately this was ligated between 2-0 silk ties and divided.  I then identified both left and right renal arteries and these were controlled with Vesseloops.  I dissected up on the anterior aorta until I got to the base of the SMA.  It should be noted that the paravesical aorta was very calcified.  Ultimately I circumferentially mobilized the infrarenal aorta with my fingers.  All lumbar branches were identified and controlled medium clips.  Patient was given 25 mannitol.  I then went ahead and created my tunnel from both groins incisions tunneling under the ureter up into the retroperitoneum.  I passed a blunt instrument and then passed Vesseloops in the bilateral groins.  That point time the patient was then given 100 units/kg heparin.  ACT checked throughout case to maintain >250.  I initially attempted to clamp in the infrarenal aorta but there was really no soft spot so then I ended up clamping both common iliacs with angled DeBakey clamps.  I then placed a zanger aortic clamp on the paravesical aorta just below the SMA.  At that point in time the aorta was then opened with heavy scissors leaving an aortic cuff for sewing ring just below the renal arteries.  The aorta was completely occluded.  I subsequently had no backbleeding from the stump and no antegrade bleeding.  I then  used Garment/textile technologist as well as the DeBakey clamp as well as Russians and attempted to establish an endarterectomy plane from the infrarenal aorta that I was carrying above the renals while everting the wall of the aorta.  Ultimately I was able to endarterectomize enough plaque that I finally had evidence of some antegrade bleeding even with a clamp in place.  Ultimately once the plaque had been circumferentially mobilized up above the renal arteries I then reached up with a hemostat and attempted to pull the plaque out while visualizing an endpoint below the SMA.  At this time we had fairly robust bleeding from just above the right renal artery.  Ultimately we had to get manual pressure on this while anesthesia caught up given significant amount of blood loss.  It did not appear that the zanger aortic clamp on the suprarenal aorta was getting hemostasis at this point after we had endarterectomized the para visceral aorta up above the renals.  Ultimately after multiple maneuvers to try and get visualization I had to convert to supraceliac clamp.  The lesser sac was opened after mobilizing the left lobe of the liver.  I mobilized the stomach and esophagus to the patient's left after feeling the NG tube.  I dissected through the crus of the diaphragm and bluntly with my finger on each side finally got a aortic clamp on the supraceliac aorta.  We then had much better control down in the paravisceral aortic segment.  Upon closer inspection it appeared that we had avulsed a accessory right renal artery branch.  Attempted to then repair this with multiple 3-0 Prolene pledgeted sutures.  Unfortunately her aorta was very thin-walled and heavily diseased and during multiple attempts at repair it appeared that the aorta started to tear with more robust bleeding every time we flashed our supraceliac clamp.  Ultimately spent an extensive amount of time repairing the segment of the aorta again with multiple 3-0 Prolene with  pledgeted suture until we finally had hemostasis.  At that point in time I was finally able to come off the supraceliac clamp and I moved the aortic clamp just below the renal arteries.  I then brought a 14 x 7 mm bifurcated dacryon graft on the field trimmed the main body the graft and used a felt strip and sewed an end-to-end anastomosis using 3-0 Prolene in parachute format.  Ultimately we came off clamp we actually had excellent hemostasis proximally.  I then tied each limb of the bifurcated graft to our umbilical tape and tunneled it under each ureter out into both groins.  Initially started in the right groin and then the left where I got proximal and distal control of common femoral arteries with baby profunda clamps.  The common femoral artery was opened with 11 blade scalpel extended with Potts scissors.  The graft spatulated and then sewn end to side to both common femoral arteries with 5-0 Prolene in parachute format.  I de-aired everything prior to completion.  I did come down checked the feet patient had PT signals bilaterally.  That point time the patient was given protamine for reversal.  I went back in the abdomen to get added hemostasis.  I found several small branches in the retroperitoneum that were oozing these were controlled with medium clips.  I had oversew the stump of the aorta above the IMA which we had initially oversewn with 3-0 Prolene but given its calcified segment here there was a fair amount of backbleeding once we established antegrade flow with aortobifem.  This was then oversewn with 3-0 Prolene again initially horizontal mattress and then reinforced with a baseball stitch.  I went up and checked our pledgeted repair of the suprarenal aorta and had fairly decent hemostasis there with minimal oozing.  I packed this off with Surgicel snow.  I evaluated the proximal anastomosis and there was one area on the left lateral wall that had some pinpoint pulsatile bleeding.  I then put a  3-0 Prolene here with a pledget and got excellent hemostasis.  The retroperitoneum was then washed out until the effluent was clear.  I did not see any other evidence of active bleeding.  The retroperitoneum was run closed with 3-0 Vicryl.  I then ran the bowel make sure everything looked healthy and viable.  I checked that we had an NG tube in the stomach.  The peritoneal cavity was then irrigated with saline.  All the bowel appeared healthy.  I did have a pulse in the SMA.  I also confirmed with Doppler that I had flow in both the right and left renal arteries with Doppler prior to closing the retroperitoneum.  The midline incision was then closed with a running 0 double-stranded PDS.  The subcutaneous  tissue was closed with 3-0 Vicryl.  Skin was closed with 4-0 Monocryl Dermabond was applied.  I then went down to both groins irrigated each one of these out and had good hemostasis.  I then used 2-0 Vicryl as well as multiple layers of 3-0 Vicryl and 4-0 Monocryl and Dermabond.  She will be taken to ICU stable, but intubated.  Condition: Patient will be transported to the ICU and remains intubated.  Complication: See findings above  Marty Heck, MD [facility] Office: [phone] Pager: [place]

## 2019-02-22 ENCOUNTER — Encounter (HOSPITAL_COMMUNITY): Payer: Self-pay | Admitting: Vascular Surgery

## 2019-02-22 ENCOUNTER — Ambulatory Visit: Payer: Medicaid Other | Admitting: Internal Medicine

## 2019-02-22 ENCOUNTER — Inpatient Hospital Stay (HOSPITAL_COMMUNITY): Payer: Medicaid Other

## 2019-02-22 DIAGNOSIS — I361 Nonrheumatic tricuspid (valve) insufficiency: Secondary | ICD-10-CM

## 2019-02-22 LAB — COMPREHENSIVE METABOLIC PANEL
ALT: 242 U/L — ABNORMAL HIGH (ref 0–44)
ALT: 326 U/L — ABNORMAL HIGH (ref 0–44)
AST: 554 U/L — ABNORMAL HIGH (ref 15–41)
AST: 1235 U/L — ABNORMAL HIGH (ref 15–41)
Albumin: 2 g/dL — ABNORMAL LOW (ref 3.5–5.0)
Albumin: 1.9 g/dL — ABNORMAL LOW (ref 3.5–5.0)
Alkaline Phosphatase: 39 U/L (ref 38–126)
Alkaline Phosphatase: 51 U/L (ref 38–126)
Anion gap: 15 (ref 5–15)
Anion gap: 10 (ref 5–15)
BUN: 11 mg/dL (ref 6–20)
BUN: 14 mg/dL (ref 6–20)
CO2: 20 mmol/L — ABNORMAL LOW (ref 22–32)
CO2: 18 mmol/L — ABNORMAL LOW (ref 22–32)
Calcium: 7.5 mg/dL — ABNORMAL LOW (ref 8.9–10.3)
Calcium: 7.1 mg/dL — ABNORMAL LOW (ref 8.9–10.3)
Chloride: 110 mmol/L (ref 98–111)
Chloride: 114 mmol/L — ABNORMAL HIGH (ref 98–111)
Creatinine, Ser: 2.25 mg/dL — ABNORMAL HIGH (ref 0.44–1.00)
Creatinine, Ser: 2.6 mg/dL — ABNORMAL HIGH (ref 0.44–1.00)
GFR calc Af Amer: 27 mL/min — ABNORMAL LOW (ref >60)
GFR calc Af Amer: 23 mL/min — ABNORMAL LOW (ref >60)
GFR calc non Af Amer: 23 mL/min — ABNORMAL LOW (ref >60)
GFR calc non Af Amer: 20 mL/min — ABNORMAL LOW (ref >60)
Glucose, Bld: 163 mg/dL — ABNORMAL HIGH (ref 70–99)
Glucose, Bld: 183 mg/dL — ABNORMAL HIGH (ref 70–99)
Potassium: 5.7 mmol/L — ABNORMAL HIGH (ref 3.5–5.1)
Potassium: 6.1 mmol/L — ABNORMAL HIGH (ref 3.5–5.1)
Sodium: 145 mmol/L (ref 135–145)
Sodium: 142 mmol/L (ref 135–145)
Total Bilirubin: 0.5 mg/dL (ref 0.3–1.2)
Total Bilirubin: 0.5 mg/dL (ref 0.3–1.2)
Total Protein: 3.1 g/dL — ABNORMAL LOW (ref 6.5–8.1)
Total Protein: 3 g/dL — ABNORMAL LOW (ref 6.5–8.1)

## 2019-02-22 LAB — POCT I-STAT 7, (LYTES, BLD GAS, ICA,H+H)
Acid-base deficit: 4 mmol/L — ABNORMAL HIGH (ref 0.0–2.0)
Acid-base deficit: 8 mmol/L — ABNORMAL HIGH (ref 0.0–2.0)
Bicarbonate: 21.3 mmol/L (ref 20.0–28.0)
Bicarbonate: 18.1 mmol/L — ABNORMAL LOW (ref 20.0–28.0)
Calcium, Ion: 1.1 mmol/L — ABNORMAL LOW (ref 1.15–1.40)
Calcium, Ion: 1 mmol/L — ABNORMAL LOW (ref 1.15–1.40)
HCT: 18 % — ABNORMAL LOW (ref 36.0–46.0)
HCT: 17 % — ABNORMAL LOW (ref 36.0–46.0)
Hemoglobin: 6.1 g/dL — CL (ref 12.0–15.0)
Hemoglobin: 5.8 g/dL — CL (ref 12.0–15.0)
O2 Saturation: 95 %
O2 Saturation: 90 %
Patient temperature: 98.2
Patient temperature: 35.6
Potassium: 5.2 mmol/L — ABNORMAL HIGH (ref 3.5–5.1)
Potassium: 6 mmol/L — ABNORMAL HIGH (ref 3.5–5.1)
Sodium: 146 mmol/L — ABNORMAL HIGH (ref 135–145)
Sodium: 144 mmol/L (ref 135–145)
TCO2: 22 mmol/L (ref 22–32)
TCO2: 19 mmol/L — ABNORMAL LOW (ref 22–32)
pCO2 arterial: 38.1 mmHg (ref 32.0–48.0)
pCO2 arterial: 35.9 mmHg (ref 32.0–48.0)
pH, Arterial: 7.355 (ref 7.350–7.450)
pH, Arterial: 7.303 — ABNORMAL LOW (ref 7.350–7.450)
pO2, Arterial: 78 mmHg — ABNORMAL LOW (ref 83.0–108.0)
pO2, Arterial: 59 mmHg — ABNORMAL LOW (ref 83.0–108.0)

## 2019-02-22 LAB — PREPARE PLATELET PHERESIS
Unit division: 0
Unit division: 0

## 2019-02-22 LAB — CBC
HCT: 18 % — ABNORMAL LOW (ref 36.0–46.0)
Hemoglobin: 5.9 g/dL — CL (ref 12.0–15.0)
MCH: 32.8 pg (ref 26.0–34.0)
MCHC: 32.8 g/dL (ref 30.0–36.0)
MCV: 100 fL (ref 80.0–100.0)
Platelets: 88 10*3/uL — ABNORMAL LOW (ref 150–400)
RBC: 1.8 MIL/uL — ABNORMAL LOW (ref 3.87–5.11)
RDW: 16.9 % — ABNORMAL HIGH (ref 11.5–15.5)
WBC: 25.5 10*3/uL — ABNORMAL HIGH (ref 4.0–10.5)
nRBC: 0.2 % (ref 0.0–0.2)

## 2019-02-22 LAB — GLUCOSE, CAPILLARY
Glucose-Capillary: 126 mg/dL — ABNORMAL HIGH (ref 70–99)
Glucose-Capillary: 140 mg/dL — ABNORMAL HIGH (ref 70–99)
Glucose-Capillary: 152 mg/dL — ABNORMAL HIGH (ref 70–99)
Glucose-Capillary: 160 mg/dL — ABNORMAL HIGH (ref 70–99)
Glucose-Capillary: 171 mg/dL — ABNORMAL HIGH (ref 70–99)
Glucose-Capillary: 41 mg/dL — CL (ref 70–99)

## 2019-02-22 LAB — BPAM FFP
Blood Product Expiration Date: 202008152359
Blood Product Expiration Date: 202008152359
Blood Product Expiration Date: 202008152359
Blood Product Expiration Date: 202008152359
ISSUE DATE / TIME: 202008101204
ISSUE DATE / TIME: 202008101204
ISSUE DATE / TIME: 202008101209
ISSUE DATE / TIME: 202008101209
Unit Type and Rh: 5100
Unit Type and Rh: 5100
Unit Type and Rh: 5100
Unit Type and Rh: 5100

## 2019-02-22 LAB — PREPARE CRYOPRECIPITATE
Unit division: 0
Unit division: 0

## 2019-02-22 LAB — BPAM PLATELET PHERESIS
Blood Product Expiration Date: 202008112359
Blood Product Expiration Date: 202008132359
ISSUE DATE / TIME: 202008101144
ISSUE DATE / TIME: 202008101147
Unit Type and Rh: 6200
Unit Type and Rh: 6200

## 2019-02-22 LAB — BPAM CRYOPRECIPITATE
Blood Product Expiration Date: 202008101957
Blood Product Expiration Date: 202008101957
ISSUE DATE / TIME: 202008101421
ISSUE DATE / TIME: 202008101421
Unit Type and Rh: 5100
Unit Type and Rh: 5100

## 2019-02-22 LAB — PREPARE FRESH FROZEN PLASMA: Unit division: 0

## 2019-02-22 LAB — LACTIC ACID, PLASMA
Lactic Acid, Venous: 6.2 mmol/L (ref 0.5–1.9)
Lactic Acid, Venous: 6.4 mmol/L (ref 0.5–1.9)
Lactic Acid, Venous: 7 mmol/L (ref 0.5–1.9)

## 2019-02-22 LAB — TRIGLYCERIDES: Triglycerides: 75 mg/dL (ref <150)

## 2019-02-22 LAB — COOXEMETRY PANEL
Carboxyhemoglobin: 1.4 % (ref 0.5–1.5)
Methemoglobin: 1.4 % (ref 0.0–1.5)
O2 Saturation: 65.4 %
Total hemoglobin: 7.8 g/dL — ABNORMAL LOW (ref 12.0–16.0)

## 2019-02-22 LAB — ECHOCARDIOGRAM LIMITED: Height: 62 in

## 2019-02-22 LAB — PREPARE RBC (CROSSMATCH)

## 2019-02-22 LAB — MAGNESIUM
Magnesium: 1.3 mg/dL — ABNORMAL LOW (ref 1.7–2.4)
Magnesium: 1.6 mg/dL — ABNORMAL LOW (ref 1.7–2.4)

## 2019-02-22 LAB — POTASSIUM: Potassium: 5.9 mmol/L — ABNORMAL HIGH (ref 3.5–5.1)

## 2019-02-22 LAB — AMYLASE: Amylase: 121 U/L — ABNORMAL HIGH (ref 28–100)

## 2019-02-22 MED ORDER — PHENYLEPHRINE HCL-NACL 10-0.9 MG/250ML-% IV SOLN
0.0000 ug/min | INTRAVENOUS | Status: DC
  Administered 2019-02-22: 23:00:00 10 ug/min via INTRAVENOUS
  Administered 2019-02-23: 08:00:00 240 ug/min via INTRAVENOUS
  Administered 2019-02-23: 06:00:00 110 ug/min via INTRAVENOUS
  Administered 2019-02-23: 08:00:00 240 ug/min via INTRAVENOUS
  Administered 2019-02-23: 04:00:00 104 ug/min via INTRAVENOUS
  Administered 2019-02-23: 02:00:00 80 ug/min via INTRAVENOUS
  Filled 2019-02-22: qty 500
  Filled 2019-02-22 (×2): qty 250
  Filled 2019-02-22: qty 500

## 2019-02-22 MED ORDER — NICOTINE 14 MG/24HR TD PT24
14.0000 mg | MEDICATED_PATCH | Freq: Every day | TRANSDERMAL | Status: DC
  Administered 2019-02-22 – 2019-02-24 (×3): 14 mg via TRANSDERMAL
  Filled 2019-02-22 (×3): qty 1

## 2019-02-22 MED ORDER — SODIUM BICARBONATE 8.4 % IV SOLN
100.0000 meq | Freq: Once | INTRAVENOUS | Status: AC
  Administered 2019-02-22: 23:00:00 100 meq via INTRAVENOUS
  Filled 2019-02-22: qty 100

## 2019-02-22 MED ORDER — SODIUM CHLORIDE 0.9 % IV BOLUS: 250.0000 mL | Freq: Once | INTRAVENOUS | Status: DC

## 2019-02-22 MED ORDER — FENTANYL CITRATE (PF) 100 MCG/2ML IJ SOLN: 50.0000 ug | INTRAMUSCULAR | Status: DC | PRN

## 2019-02-22 MED ORDER — NOREPINEPHRINE 16 MG/250ML-% IV SOLN
0.0000 ug/min | INTRAVENOUS | Status: DC
  Administered 2019-02-22: 20:00:00 2 ug/min via INTRAVENOUS
  Administered 2019-02-23: 11:00:00 24 ug/min via INTRAVENOUS
  Administered 2019-02-23: 22:00:00 30 ug/min via INTRAVENOUS
  Administered 2019-02-24: 07:00:00 35 ug/min via INTRAVENOUS
  Administered 2019-02-24: 15:00:00 40 ug/min via INTRAVENOUS
  Filled 2019-02-22 (×5): qty 250

## 2019-02-22 MED ORDER — NOREPINEPHRINE 4 MG/250ML-% IV SOLN
0.0000 ug/min | INTRAVENOUS | Status: DC
  Administered 2019-02-22: 13:00:00 2.6667 ug/min via INTRAVENOUS
  Filled 2019-02-22: qty 250

## 2019-02-22 MED ORDER — SODIUM POLYSTYRENE SULFONATE 15 GM/60ML PO SUSP
30.0000 g | Freq: Once | ORAL | Status: AC
  Administered 2019-02-22: 30 g
  Filled 2019-02-22: qty 120

## 2019-02-22 MED ORDER — PRISMASOL BGK 0/2.5 32-2.5 MEQ/L IV SOLN
INTRAVENOUS | Status: DC
  Administered 2019-02-22 – 2019-02-24 (×3): via INTRAVENOUS_CENTRAL
  Filled 2019-02-22 (×11): qty 5000

## 2019-02-22 MED ORDER — SODIUM CHLORIDE 0.9% IV SOLUTION: Freq: Once | INTRAVENOUS | Status: DC

## 2019-02-22 MED ORDER — HEPARIN (PORCINE) 2000 UNITS/L FOR CRRT
INTRAVENOUS_CENTRAL | Status: DC | PRN
  Administered 2019-02-22: 18:00:00 via INTRAVENOUS_CENTRAL
  Filled 2019-02-22 (×2): qty 1000

## 2019-02-22 MED ORDER — SODIUM BICARBONATE 8.4 % IV SOLN
INTRAVENOUS | Status: AC
  Administered 2019-02-22: 20:00:00 100 meq via INTRAVENOUS
  Filled 2019-02-22: qty 100

## 2019-02-22 MED ORDER — SODIUM CHLORIDE 0.9 % IV BOLUS
1000.0000 mL | Freq: Once | INTRAVENOUS | Status: AC
  Administered 2019-02-22: 05:00:00 1000 mL via INTRAVENOUS

## 2019-02-22 MED ORDER — PRISMASOL BGK 0/2.5 32-2.5 MEQ/L IV SOLN
INTRAVENOUS | Status: DC
  Administered 2019-02-22 – 2019-02-24 (×2): via INTRAVENOUS_CENTRAL
  Filled 2019-02-22 (×6): qty 5000

## 2019-02-22 MED ORDER — FUROSEMIDE 10 MG/ML IJ SOLN
80.0000 mg | Freq: Once | INTRAMUSCULAR | Status: AC
  Administered 2019-02-22: 13:00:00 80 mg via INTRAVENOUS
  Filled 2019-02-22: qty 8

## 2019-02-22 MED ORDER — DEXTROSE 50 % IV SOLN
50.0000 mL | Freq: Once | INTRAVENOUS | Status: AC
  Administered 2019-02-23: 50 mL via INTRAVENOUS

## 2019-02-22 MED ORDER — MIDAZOLAM HCL 2 MG/2ML IJ SOLN
INTRAMUSCULAR | Status: AC
  Filled 2019-02-22: qty 2

## 2019-02-22 MED ORDER — VASOPRESSIN 20 UNIT/ML IV SOLN
0.0400 [IU]/min | INTRAVENOUS | Status: DC
  Administered 2019-02-22: 20:00:00 0.03 [IU]/min via INTRAVENOUS
  Administered 2019-02-23 – 2019-02-24 (×2): 0.04 [IU]/min via INTRAVENOUS
  Filled 2019-02-22 (×5): qty 2

## 2019-02-22 MED ORDER — DEXTROSE-NACL 5-0.45 % IV SOLN
INTRAVENOUS | Status: DC
  Administered 2019-02-22 (×2): via INTRAVENOUS

## 2019-02-22 MED ORDER — DEXTROSE 50 % IV SOLN
INTRAVENOUS | Status: AC
  Administered 2019-02-23: 50 mL via INTRAVENOUS
  Filled 2019-02-22: qty 50

## 2019-02-22 MED ORDER — MAGNESIUM SULFATE IN D5W 1-5 GM/100ML-% IV SOLN
1.0000 g | Freq: Once | INTRAVENOUS | Status: AC
  Administered 2019-02-22: 06:00:00 1 g via INTRAVENOUS
  Filled 2019-02-22: qty 100

## 2019-02-22 MED ORDER — STERILE WATER FOR INJECTION IV SOLN
INTRAVENOUS | Status: DC
  Administered 2019-02-22 – 2019-02-24 (×9): via INTRAVENOUS_CENTRAL
  Filled 2019-02-22 (×19): qty 150

## 2019-02-22 MED ORDER — HEPARIN SODIUM (PORCINE) 1000 UNIT/ML DIALYSIS
1000.0000 [IU] | INTRAMUSCULAR | Status: DC | PRN
  Filled 2019-02-22 (×2): qty 6

## 2019-02-22 MED ORDER — FENTANYL 2500MCG IN NS 250ML (10MCG/ML) PREMIX INFUSION
0.0000 ug/h | INTRAVENOUS | Status: DC
  Administered 2019-02-22: 15:00:00 50 ug/h via INTRAVENOUS
  Administered 2019-02-23: 08:00:00 200 ug/h via INTRAVENOUS
  Administered 2019-02-24: 225 ug/h via INTRAVENOUS
  Filled 2019-02-22 (×3): qty 250

## 2019-02-22 MED ORDER — ALBUMIN HUMAN 25 % IV SOLN
12.5000 g | Freq: Once | INTRAVENOUS | Status: AC
  Administered 2019-02-22: 13:00:00 12.5 g via INTRAVENOUS
  Filled 2019-02-22: qty 50

## 2019-02-22 MED ORDER — SODIUM BICARBONATE 8.4 % IV SOLN
100.0000 meq | Freq: Once | INTRAVENOUS | Status: AC
  Administered 2019-02-22: 20:00:00 100 meq via INTRAVENOUS

## 2019-02-22 MED ORDER — STERILE WATER FOR INJECTION IV SOLN
INTRAVENOUS | Status: DC
  Administered 2019-02-22: via INTRAVENOUS
  Filled 2019-02-22 (×2): qty 850

## 2019-02-22 MED FILL — Heparin Sodium (Porcine) Inj 1000 Unit/ML: INTRAMUSCULAR | Qty: 30 | Status: AC

## 2019-02-22 MED FILL — Sodium Chloride Irrigation Soln 0.9%: Qty: 3000 | Status: AC

## 2019-02-22 MED FILL — Sodium Chloride IV Soln 0.9%: INTRAVENOUS | Qty: 2000 | Status: AC

## 2019-02-22 NOTE — Progress Notes (Signed)
OT Cancellation Note  Patient Details Name: Kristina Mayer MRN: [id number] DOB: Mar 15, 1961   Cancelled Treatment:    Reason Eval/Treat Not Completed: Medical issues which prohibited therapy. HGB 6.1 and potassium 5.2. Will follow and initate OT eval when medically appropriate and able.   Delight Stare, OT Acute Rehabilitation Services Pager [phone] Office [phone]    Delight Stare 02/22/2019, 7:56 AM

## 2019-02-22 NOTE — Progress Notes (Signed)
PT Cancellation Note  Patient Details Name: Kristina Mayer MRN: [id number] DOB: 1961-01-20   Cancelled Treatment:    Reason Eval/Treat Not Completed: Medical issues which prohibited therapy(HGB 6.1 and potassium 5.2.) Will check back tomorrow.     Denice Paradise 02/22/2019, 8:48 AM Fifi Schindler,PT Acute Rehabilitation Services Pager:  [phone]  Office:  [phone]

## 2019-02-22 NOTE — Procedures (Signed)
Hemodialysis Catheter Insertion Procedure Note Kristina Mayer [id number] 04-14-1961  Procedure: Insertion of Hemodialysis Catheter Indications: Dialysis Access   Procedure Details Consent: Risks of procedure as well as the alternatives and risks of each were explained to the (patient/caregiver).  Consent for procedure obtained. Time Out: Verified patient identification, verified procedure, site/side was marked, verified correct patient position, special equipment/implants available, medications/allergies/relevent history reviewed, required imaging and test results available.  Performed  Maximum sterile technique was used including antiseptics, cap, gloves, gown, hand hygiene, mask and sheet. Skin prep: Chlorhexidine; local anesthetic administered Triple lumen hemodialysis catheter was inserted into left internal jugular vein using the Seldinger technique.  Evaluation Blood flow good Complications: No apparent complications Patient did tolerate procedure well. Chest X-ray ordered to verify placement.  CXR: pending.   Einar Grad Rita Prom 02/22/2019

## 2019-02-22 NOTE — Progress Notes (Signed)
eLink Physician-Brief Progress Note Patient Name: Kristina Mayer DOB: [birth year] MRN: [id number]   Date of Service  02/22/2019  HPI/Events of Note  Hypotension - BP = 89/52 with MAP = 63. CVP = 8 and LVEF = 55-60%.  eICU Interventions  Will order: 1. Bolus with 0.9 NaCl 1 liter IV over 1 hour now.     Intervention Category Major Interventions: Hypotension - evaluation and management  Sommer,Steven Eugene 02/22/2019, 4:32 AM

## 2019-02-22 NOTE — Consult Note (Signed)
Reason for Consult: ARF and hyperkalemia Referring Physician:  Lynetta Mare, MD  Kristina Mayer is an 58 y.o. female.  HPI: Kristina Mayer has a PMH significant for CAD, HTN, CVA, HLD, diastolic dysfunction, tobacco abuse, and PAD who was admitted on 02/14/2019 for an elective aortobifemoral bypass graft due to aortoiliac occlusive disease.  Her post operative course was complicated by a prolonged surgery and supra-renal aortic cross clamp time with significant bleeding from aortic avulsion.  She also had ligation of left renal vein to expose perivisceral aorta.  She also suffered avulsion of the right renal accessory artery s/p repair.  She also had to undergo significant repair of the aorta as well.  She received 6 units of PRBCs and 4 units of FFP, 2 units of platelets, 2 units of cryoprecipitate, and 1100 ml of  Cell Saver.  She was transferred to the ICU post-operatively in stable condition and still intubated.  She developed hypotension early this morning and responded to IV NS but failed a spontaneous breathing trial due to volume overload and encephalopathy.  Her hospital course has now been further complicated by the development of oliguric AKI along with metabolic acidosis and hyperkalemia.  We were consulted to help further evaluate and manage her AKI and electrolyte abnormalities as well as evaluate for possible CVVHD.  The trend in Scr is seen below.   Trend in Creatinine: Creatinine, Ser  Date/Time Value Ref Range Status  02/22/2019 11:13 AM 2.60 (H) 0.44 - 1.00 mg/dL Final  02/22/2019 03:30 AM 2.25 (H) 0.44 - 1.00 mg/dL Final  02/12/2019 04:30 PM 1.43 (H) 0.44 - 1.00 mg/dL Final  02/20/2019 04:22 PM 1.32 (H) 0.44 - 1.00 mg/dL Final  02/15/2019 02:50 PM 0.88 0.44 - 1.00 mg/dL Final  04/16/2018 07:37 AM 0.98 0.44 - 1.00 mg/dL Final  06/13/2013 04:16 PM 0.55 0.50 - 1.10 mg/dL Final  12/09/2010 05:38 AM 0.47 0.4 - 1.2 mg/dL Final  12/08/2010 07:56 AM 0.51 0.4 - 1.2 mg/dL Final  12/08/2010 01:54 AM  0.48 0.4 - 1.2 mg/dL Final  12/07/2010 08:00 PM 0.57 0.4 - 1.2 mg/dL Final    PMH:   Past Medical History:  Diagnosis Date  . Allergic rhinitis   . Allergy   . Aortoiliac occlusive disease ([place])   . Blood transfusion   . Cervical cancer ([place])   . Chronic headaches   . Coronary artery disease   . Cyst    by left eye  . Hyperlipidemia   . Hypertension   . Stroke ([place]) 2012   . 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 ABDOMINAL HYSTERECTOMY    . CARDIAC VALVE SURGERY    . COLONOSCOPY    . EYE SURGERY      Allergies: No Known Allergies  Medications:   Prior to Admission medications   Medication Sig Start Date End Date Taking? Authorizing Provider  aspirin EC 325 MG tablet Take 325 mg by mouth daily.   Yes [provider]  atorvastatin (LIPITOR) 40 MG tablet Take 1 tablet (40 mg total) by mouth daily. 02/03/19 05/04/19 Yes Miquel Dunn, NP  butalbital-acetaminophen-caffeine (FIORICET) [phone] MG per tablet Take 1 tablet by mouth 2 (two) times daily as needed for headache. 02/21/14  Yes Advani, Vernon Prey, MD  clopidogrel (PLAVIX) 75 MG tablet Take 75 mg by mouth daily.    Yes [provider]  DULoxetine (CYMBALTA) 30 MG capsule Take 30 mg by mouth  2 (two) times daily.   Yes [provider]  lisinopril (PRINIVIL,ZESTRIL) 20 MG tablet Take 20 mg by mouth daily.   Yes [provider]  loratadine (CLARITIN) 10 MG tablet Take 1 tablet (10 mg total) by mouth daily. 01/23/14  Yes Advani, Vernon Prey, MD  metoprolol succinate (TOPROL XL) 25 MG 24 hr tablet Take 1 tablet (25 mg total) by mouth daily. 02/03/19  Yes Miquel Dunn, NP  potassium chloride (MICRO-K) 10 MEQ CR capsule Take 10 mEq by mouth daily.   Yes [provider]  OVER THE COUNTER MEDICATION Apply 1 application topically daily. Theraworx topical cream    [provider]    Inpatient medications: .  chlorhexidine gluconate (MEDLINE KIT)  15 mL Mouth Rinse BID  . Chlorhexidine Gluconate Cloth  6 each Topical Daily  . docusate sodium  100 mg Oral Daily  . heparin injection (subcutaneous)  5,000 Units Subcutaneous Q8H  . insulin aspart  0-15 Units Subcutaneous Q4H  . mouth rinse  15 mL Mouth Rinse 10 times per day  . midazolam      . nicotine  14 mg Transdermal Daily  . pantoprazole (PROTONIX) IV  40 mg Intravenous Q24H  . potassium chloride  40 mEq Per NG tube BID    Discontinued Meds:   Medications Discontinued During This Encounter  Medication Reason  . mirtazapine (REMERON) 15 MG tablet Patient Preference  . ceFAZolin (ANCEF) 3 g in dextrose 5 % 50 mL IVPB Dose change  . hemostatic agents Patient Discharge  . heparin 6,000 Units in sodium chloride 0.9 % 500 mL irrigation Patient Discharge  . 0.9 % irrigation (POUR BTL) Patient Discharge  . Chlorhexidine Gluconate Cloth 2 % PADS 6 each Patient Transfer  . Chlorhexidine Gluconate Cloth 2 % PADS 6 each Patient Transfer  . 0.9 %  sodium chloride infusion Patient Transfer  . clevidipine (CLEVIPREX) infusion 0.5 mg/mL Patient Transfer  . norepinephrine (LEVOPHED) 34m in 2516mpremix infusion Patient Transfer  . morphine 2 MG/ML injection 1-2 mg   . pantoprazole (PROTONIX) EC tablet 40 mg   . sodium chloride 0.9 % bolus 500 mL   . sodium chloride 0.9 % bolus 250 mL   . dextrose 5 % and 0.45 % NaCl with KCl 20 mEq/L infusion     Social History:  reports that she has been smoking cigarettes. She has a 20.00 pack-year smoking history. She has never used smokeless tobacco. She reports that she does not drink alcohol or use drugs.  Family History:   Family History  Problem Relation Age of Onset  . Colon cancer Paternal Aunt   . Esophageal cancer Maternal Grandfather   . Cancer Mother        brain  . Stroke Mother   . Hypertension Mother   . Hypertension Sister   . Stroke Maternal Uncle   . Heart attack Brother   . Heart  disease Brother   . Rectal cancer Neg Hx   . Stomach cancer Neg Hx     Review of systems not obtained due to patient factors. Weight change:   Intake/Output Summary (Last 24 hours) at 02/22/2019 1525 Last data filed at 02/22/2019 1300 Gross per 24 hour  Intake 7094.2 ml  Output 360 ml  Net 6734.2 ml   BP 119/78   Pulse 93   Temp 98.2 F (36.8 C)   Resp (!) 29   Ht 5' 2"  (1.575 m)   SpO2 99%   BMI (P) 16.57  kg/m  Vitals:   02/22/19 1345 02/22/19 1400 02/22/19 1415 02/22/19 1430  BP:  117/77  119/78  Pulse: 90 94 94 93  Resp: (!) 28 (!) 30 (!) 30 (!) 29  Temp:      TempSrc:      SpO2: 99% 98% 98% 99%  Height:         General appearance: intubated and sedated Resp: clear to auscultation bilaterally Cardio: regular rate and rhythm and no rub GI: + BS, midline incision intact wtihout evidence of bleeding Extremities: extremities normal, atraumatic, no cyanosis or edema Neuro: sedated and unresponsive  Labs: Basic Metabolic Panel: Recent Labs  Lab 03/08/2019 1153  03/08/2019 1324 03/13/2019 1344 03/13/2019 1622 03/13/2019 1630 02/16/2019 2247 02/22/19 0330 02/22/19 0455 02/22/19 1113  NA 142   < > 147* 148*  --  148* 148* 145 146* 142  K 4.8   < > 3.5 3.5  --  3.4* 4.7 5.7* 5.2* 6.1*  CL  --   --   --   --   --  107  --  110  --  114*  CO2  --   --   --   --   --  25  --  20*  --  18*  GLUCOSE 675*  --  227*  --   --  214*  --  163*  --  183*  BUN  --   --   --   --   --  6  --  11  --  14  CREATININE  --   --   --   --  1.32* 1.43*  --  2.25*  --  2.60*  ALBUMIN  --   --   --   --   --  2.5*  --  2.0*  --  1.9*  CALCIUM  --   --   --   --   --  9.1  --  7.5*  --  7.1*  PHOS  --   --   --   --   --  5.2*  --   --   --   --    < > = values in this interval not displayed.   Liver Function Tests: Recent Labs  Lab 03/02/2019 1630 02/22/19 0330 02/22/19 1113  AST  --  554* 1,235*  ALT  --  242* 326*  ALKPHOS  --  39 51  BILITOT  --  0.5 0.5  PROT  --  3.1* 3.0*   ALBUMIN 2.5* 2.0* 1.9*   Recent Labs  Lab 02/22/19 0330  AMYLASE 121*   No results for input(s): AMMONIA in the last 168 hours. CBC: Recent Labs  Lab 02/12/2019 1622  03/03/2019 1745 02/18/2019 2229 03/09/2019 2247 02/22/19 0330 02/22/19 0455 02/22/19 1113  WBC 16.4*  --   --  14.2*  --  16.3*  --  22.9*  HGB 11.3*  --   --  7.9* 6.5* 8.4* 6.1* 8.0*  HCT 32.3*  --   --  22.8* 19.0* 24.1* 18.0* 23.4*  MCV 93.4  --   --  92.3  --  92.0  --  94.0  PLT 141*   < > 141* 122*  --  125*  --  113*   < > = values in this interval not displayed.   PT/INR: @LABRCNTIP (inr:5) Cardiac Enzymes: )No results for input(s): CKTOTAL, CKMB, CKMBINDEX, TROPONINI in the last 168 hours. CBG: Recent Labs  Lab 02/22/2019 1819 02/27/2019  1953 02/20/2019 2313 02/22/19 0349 02/22/19 0738  GLUCAP 209* 195* 169* 152* 160*    Iron Studies: No results for input(s): IRON, TIBC, TRANSFERRIN, FERRITIN in the last 168 hours.  Xrays/Other Studies: Dg Chest Port 1 View  Result Date: 02/19/2019 CLINICAL DATA:  Postop EXAM: PORTABLE CHEST 1 VIEW COMPARISON:  February 14, 2018 FINDINGS: There is a right-sided central venous catheter with the tip at the cavoatrial junction. ET tube is seen 1.5 cm above the level of the carina. NG tube is seen coursing below the diaphragm. There is hazy airspace opacity seen within the right lung base. There is a trace right pleural effusion. Again noted is hyperinflation of the lung zones with biapical scarring. Increased interstitial markings seen throughout both lungs. The cardiomediastinal silhouette is unchanged. IMPRESSION: Central venous catheter with the tip at the superior cavoatrial junction. ET tube and enteric tube in satisfactory position Hazy airspace opacity at the right lower lung which could be layering effusion/atelectasis. Trace right pleural effusion Findings of COPD Electronically Signed   By: Prudencio Pair M.D.   On: 02/12/2019 18:16     Assessment/Plan: 1.  AKI- due to  ischemic ATN in setting of prolonged supra-renal aorta cross clamp, ABLA, and hypotension.  Given persistent oliguria, metabolic acidosis and hyperkalemia, will plan to initiate CVVHD.  Case was discussed with Dr. Ashley Mariner who will kindly place HD catheter. 2. VDRF- per PCCM 3. Volume overload- after massive volume expansion.  Will UF as BP tolerates but try to keep MAP around 70 4. ABLA- Hgb up to 8.3 today.  Continue to follow and transfuse prn 5. Thrombocytopenia- likely functional consumption.  Continue to follow and transfuse prn 6. Abnormal LFT's- likely due to shock liver.  Continue to follow levels and stop statin. 7. HTN- stable and not on pressors.   Kristina Mayer Kristina Mayer 02/22/2019, 3:25 PM

## 2019-02-22 NOTE — Progress Notes (Addendum)
eLink Physician-Brief Progress Note Patient Name: Kristina Mayer DOB: 1961/03/03 MRN: [id number]   Date of Service  02/22/2019  HPI/Events of Note  Hypotension - SBP dropped to 30 on trying to start CRRT. CVP = 14. Hgb = 8.0.  eICU Interventions  Will order: 1. ABG now. 2. Increase ceiling on Norepinephrine IV infusion to 60 mcg/min. 3. Vasopressin IV infusion to run IV at shock dose.  4. CBC and Lactic Acid level now.  5. Increase ceiling on Fentanyl IV infusion to 400 mcg/hour. 6. Wean Propofol IV infusion off.      Intervention Category Major Interventions: Hypotension - evaluation and management  Sommer,Steven Eugene 02/22/2019, 7:21 PM

## 2019-02-22 NOTE — Progress Notes (Addendum)
eLink Physician-Brief Progress Note Patient Name: Kristina Mayer DOB: Jul 15, 1960 MRN: [id number]   Date of Service  02/22/2019  HPI/Events of Note  Multiple issues: 1. Hypotension BP = 83/67 (74). CVP = 15. Hgb = 5.9. Already being transfused 2 units PRBC and 2. Sinus tachycardia - HR = 144 - Likely d/t Norepinephrine IV infusion.   eICU Interventions  Will order: 1. NaHCO3 100 meq IV now.  2. NaHCO3 IV infusion to run IV at 75 mL/hour. 3. Repeat ABG at 1 AM. 4. Phenylephrine IV infusion. Titrate to MAP >= 65.  5. Wean Norepinephrine IV infusion off as tolerated.      Intervention Category Major Interventions: Hypotension - evaluation and management  [name] Eugene 02/22/2019, 11:03 PM

## 2019-02-22 NOTE — Progress Notes (Signed)
Discussed with Dr. Lynetta Mare plan to consult nephrology initiate CRRT.  Appreciate nephrology and critical care input.  She has remained anuric throughout most of the day even with significant fluid challenge with electrolyte abnormalities.  Hemoglobin has remained stable around 8 from last night.  I do not think she is bleeding.  She has not cleared her lactic acid and we did discuss possible underlying bowel ischemia, but lactic acid slightly decreased and not rising with no hemodynamic instability.  That being said I think ongoing supportive care at this time is the best option with CRRT and continuing to follow her lactic acid as well as other labs.  We will see if she declares herself with hemodynamic instability or rising lactic acid.  Marty Heck, MD [facility] Office: [phone] Pager: [place]

## 2019-02-22 NOTE — Progress Notes (Signed)
Initial Nutrition Assessment  RD working remotely.  DOCUMENTATION CODES:   Underweight, suspect malnutrition but unable to confirm without NFPE  INTERVENTION:   Tube feeding recommendations: - Vital AF 1.2 @ 40 ml/hr (960 ml/day) via NG tube  Recommended tube feeding regimen provides 1152 kcal, 72 grams of protein, and 779 ml of H2O.   Recommended tube feeding regimen and current propofol provides 1250 total kcal (100% of needs).  NUTRITION DIAGNOSIS:   Increased nutrient needs related to post-op healing as evidenced by estimated needs.  GOAL:   Patient will meet greater than or equal to 90% of their needs  MONITOR:   Vent status, Labs, Weight trends, Skin, I & O's  REASON FOR ASSESSMENT:   Ventilator    ASSESSMENT:   58 year old female who presented on 8/10 for aortobifemoral bypass graft and aortic endarterectomy. Surgery complicated by EBL of 0177. Pt was resuscitated with 6u pRBCs, 4u FFP, 2u platelets, 2u cryoprecipitate, and 1100 mL Cell Saver. PMH of PAD, HTN, HLD, CVA in 2012.   Pt remains intubated post-op.  NG tube in right nare, currently to low intermittent suction.  Reviewed weight history in chart. Noted pt with 6.1 kg weight loss over the last 10 months. This is a 12.9% weight loss which is not quite significant for timeframe. Strongly suspect pt with malnutrition but unable to diagnose without NFPE.  Per RN edema assessment, pt with generalized edema.  Patient is currently intubated on ventilator support MV: 12 L/min Temp (24hrs), Avg:97.4 F (36.3 C), Min:92.8 F (33.8 C), Max:100.8 F (38.2 C) BP (a-line): 131/68 MAP (a-line): 86  Drips: Propofol: 3.7 ml/hr (provides 98 kcal daily from lipid) Levophed: 30 ml/hr D5 in 1/2NS: 125 ml/hr LR: 125 ml/hr  Medications reviewed and include: Colace, SSI q 4 hours, Protonix  Labs reviewed: potassium 6.1, creatinine 2.60, magnesium 1.6, elevated LFTs, hemoglobin 8.0 CBG's: 152-209 x 24  hours  UOP: 690 ml x 24 hours I/O's: +12.7 L since admit  NUTRITION - FOCUSED PHYSICAL EXAM:  Unable to complete at this time. RD working remotely.  Diet Order:   Diet Order            Diet NPO time specified  Diet effective now              EDUCATION NEEDS:   No education needs have been identified at this time  Skin:  Skin Assessment: Skin Integrity Issues: Incisions: abdomen, bilateral groin  Last BM:  02/22/19  Height:   Ht Readings from Last 1 Encounters:  02/22/2019 5\' 2"  (1.575 m)    Weight:   Wt Readings from Last 1 Encounters:  02/15/19 (P) 41.1 kg    Ideal Body Weight:  50 kg  BMI:  Body mass index is 16.57 kg/m (pended).  Estimated Nutritional Needs:   Kcal:  1250  Protein:  60-75 grams  Fluid:  >/= 1.3 L    Gaynell Face, MS, RD, LDN Inpatient Clinical Dietitian Pager: [phone] Weekend/After Hours: [phone]

## 2019-02-22 NOTE — Progress Notes (Signed)
[facility] intubated. 2 L NaCl overnight for low BP.  UOP remains low.     Objective 128/67 88 (!) 100.8 F (38.2 C) (Oral) (!) 31 99%  Intake/Output Summary (Last 24 hours) at 02/22/2019 0824 Last data filed at 02/22/2019 0800 Gross per 24 hour  Intake 16306.96 ml  Output 4415 ml  Net 11891.96 ml    Abdominal and bilateral groin incisions c/d/i Brisk DP/Pt signals BLE Feet warm  Laboratory Lab Results: Recent Labs    02/22/2019 2229  02/22/19 0330 02/22/19 0455  WBC 14.2*  --  16.3*  --   HGB 7.9*   < > 8.4* 6.1*  HCT 22.8*   < > 24.1* 18.0*  PLT 122*  --  125*  --    < > = values in this interval not displayed.   BMET Recent Labs    02/14/2019 1630  02/22/19 0330 02/22/19 0455  NA 148*   < > 145 146*  K 3.4*   < > 5.7* 5.2*  CL 107  --  110  --   CO2 25  --  20*  --   GLUCOSE 214*  --  163*  --   BUN 6  --  11  --   CREATININE 1.43*  --  2.25*  --   CALCIUM 9.1  --  7.5*  --    < > = values in this interval not displayed.    COAG Lab Results  Component Value Date   INR 1.6 (H) 03/12/2019   INR  03/08/2019    SPECIMEN HEMOLYZED. HEMOLYSIS MAY AFFECT INTEGRITY OF RESULTS.   INR 1.0 02/15/2019   No results found for: PTT  Assessment/Planning: POD #1 s/p aortic endarterectomy and aortobifemoral bypass for occlusive disease  N: Not following commands, no focal deficits, intubated CV: HDS this am, 2 L crystalloid overnight, Hgb 11.3 --> 7.9 --> 8.4 overnight, no additional blood products given, recheck labs at 12 noon today including LA, trending down from 7.5 --> 6.2 Pulm: Intubated, Peep 5, FiO2 40%, wean to extubate GI: NPO, NG ILWS Renal: Low UOP, supra-renal and supra-celiac clamp, will monitor UOP 5-15 mL/hr, NaCl 125 mL, Cr 0.88 --> 1.32 --> 2.25 FEN: Replaced Mg, Hyperkalemia treated overnight by critical care - recheck labs at noon ID: Ancef prophylaxis  Overall looks ok.  Still  intubated.  Hemodynamics ok this am, no drips,  Hgb trending down but some dilution from fluids overnight.  Brisk DP/PT signals.  Will monitor renal function and liver enzymes given supra-renal and supra-celiac clamp.  Recheck labs at noon.  Appreciate critical care.  Marty Heck 02/22/2019 8:24 AM --

## 2019-02-22 NOTE — Progress Notes (Signed)
  Echocardiogram 2D Echocardiogram has been performed.  Kristina Mayer 02/22/2019, 1:23 PM

## 2019-02-22 NOTE — Progress Notes (Signed)
eLink Physician-Brief Progress Note Patient Name: Kristina Mayer DOB: 1961/07/03 MRN: [id number]   Date of Service  02/22/2019  HPI/Events of Note  ABG on 40%/PRVC 12/TV 400/P 5 = 7.18/41/85/15.7  eICU Interventions  Will order: 1. Increase PRVC rate to 26. 2. NaHCO3 100 meq IV now. 3. Repeat ABG at 8:30 PM.     Intervention Category Major Interventions: Acid-Base disturbance - evaluation and management;Respiratory failure - evaluation and management  Lysle Dingwall 02/22/2019, 7:34 PM

## 2019-02-22 NOTE — Progress Notes (Signed)
eLink Physician-Brief Progress Note Patient Name: Kristina Mayer DOB: 07/10/1961 MRN: [id number]   Date of Service  02/22/2019  HPI/Events of Note  ABG on 40%/PRVC 24/TV 400/P 5 = 7.303/35.9/59.0  eICU Interventions  Will order: 1. Increase PEEP to 8. 2. Repeat ABG already ordered for AM.      Intervention Category Major Interventions: Acid-Base disturbance - evaluation and management;Respiratory failure - evaluation and management  Lysle Dingwall 02/22/2019, 9:43 PM

## 2019-02-22 NOTE — Progress Notes (Signed)
NAME:  Kristina Mayer, MRN:  [id number], DOB:  Jun 18, 1961, LOS: 1 ADMISSION DATE:  03/09/2019, CONSULTATION DATE:  03/12/2019 REFERRING MD:  Dr. Carlis Abbott, Vascular Surgery, CHIEF COMPLAINT:  CLI of BLE and aortoiliac disease, S/p aortobifemoral bypass and aortic endarterectomy   Brief History   58F with PHMx of HTN, hyperlipidemia, CVA (2012), hx of PDA repair, CLI of BLE, aortoiliac occlusive disease, who is now s/p aortobifemoral bypass and aortic endarterectomy of infra-renal and supra-renal aorta (03/05/2019). Surgery complicated by EBL of 2637. Patient was resuscitated with 6u pRBCs, 4u FFP, 2u platelets, 2u cryoprecipitate, and 1100 mL Cell Saver. Transferred to ICU post-operatively, in stable condition, still intubated.  History of present illness   Kristina Mayer is a 58 year old woman with a PHMx significant for HTN, hyperlipidemia, CVA (2012), hx of PDA repair, tobacco use disorder, CLI of BLE, aortoiliac occlusive disease, who is now s/p aortobifemoral bypass and aortic endarterectomy of infra-renal and supra-renal aorta (02/17/2019). Surgery complicated by EBL of 8588. Patient was resuscitated with 6u pRBCs, 4u FFP, 2u platelets, 2u cryoprecipitate, and 1100 mL Cell Saver. Transferred to ICU post-operatively, in stable condition, still intubated.  Past Medical History  Hypertension Hyperlipidemia Chronic Limb Ischemia Aortoiliac occlusive disease Tobacco use disorder History of PDA repair  Significant Hospital Events   8/10: Aortobifemoral bypass and aortic endarterectomy    Consults:  Vascular Surgery 8/10 PCCM 8/10  Procedures:  8/10: Aortobifemoral bypass and aortic endarterectomy ETT 8/10 >>   Significant Diagnostic Tests:  CT Angiography (02/02/19): Advanced aortic atherosclerosis, with bulky calcified and soft plaque of the juxtarenal aorta/infrarenal aorta nearly filling the aortic lumen, and contributing to occlusion just above the IMA takeoff. 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 Bilateral left greater than right renal arterial disease with high-grade stenosis of the main left renal artery and associated renal cortical thinning. Bilateral iliac arterial disease with at least 50% narrowing at 2 locations of the right common iliac artery, and more mild left common iliac arterial disease.  Echo (02/07/19): EF 55-60% with some diastolic dysfunction noted  Lexiscan (02/07/19): Myocardial perfusion imaging normal, low risk study.  Micro Data:  SARS CoV 2 (02/17/19): Negative MRSA PCR (02/15/19): Negative  Antimicrobials:  Cefazolin (8/10) for surgical prophylaxis  Interim history/subjective:  Overnight, patient received 2L IVF for hypotension. She has decreased urine output, with ~200 mL UOP overnight. She also received Kayexalate for potassium level of 5.7 (no EKG changes noted), and has had a bowel movement this morning. Additionally, she had a Tmax of 38.2 overnight. Of note, lactic acid has not cleared (7.5>> 6.1 >> 6.2 >> 6.4).  Kristina Mayer remains intubated and sedated. She started on norepinephrine today (8/11) for persistent hypotension.     Objective   Blood pressure 96/69, pulse (!) 40, temperature 98.2 F (36.8 C), resp. rate (!) 26, height 5' 2"  (1.575 m), SpO2 (!) 72 %. CVP:  [4 mmHg-8 mmHg] 4 mmHg  Vent Mode: PRVC FiO2 (%):  [40 %-50 %] 40 % Set Rate:  [12 bmp] 12 bmp Vt Set:  [400 mL-490 mL] 400 mL PEEP:  [5 cmH20] 5 cmH20 Pressure Support:  [8 cmH20-10 cmH20] 8 cmH20 Plateau Pressure:  [13 cmH20-17 cmH20] 14 cmH20   Intake/Output Summary (Last 24 hours) at 02/22/2019 1320 Last data filed at 02/22/2019 1300 Gross per 24 hour  Intake 9974.2 ml  Output 365 ml  Net 9609.2  ml   There were no vitals filed for this visit.  Examination: General: Intubated, sedated HENT: ETT in place. NG tube in place. Pupils equal,  reactive. Neck: Supple. JVD difficult to assess.  Lungs: Clear to anterior auscultation bilaterally. Equal breath sounds bilaterally.  Cardiovascular: Normal rate, normal S1, S2, no murmurs appreciated. Unable to palpate dorsalis pedis pulse, but able to obtain Doppler signal bilaterally. Abdomen: Soft, non-distended. Positive bowel sounds. Midline abdominal incision and bilateral groin incisions are c/d/i.  Extremities: No lower extremity edema. Shiny skin, loss of hair, and muscle atrophy noted on bilateral lower extremities consistent with arterial insufficiency. Neuro: Sedated, not opening eyes or following commands.  GU: Foley catheter in place, draining normal appearing urine.  Resolved Hospital Problem list     Assessment & Plan:  Kristina Mayer is a 58 year old woman with a PHMx significant for HTN, hyperlipidemia, CVA (2012), hx of PDA repair, tobacco use disorder, CLI of BLE, aortoiliac occlusive disease, who is now s/p aortobifemoral bypass and aortic endarterectomy of infra-renal and supra-renal aorta (03/14/2019). Surgery complicated by EBL of 6568. Patient was resuscitated with 6u pRBCs, 4u FFP, 2u platelets, 2u cryoprecipitate, and 1100 mL Cell Saver. Transferred to ICU post-operatively, in stable condition, intubated.  Acute respiratory failure requiring mechanical ventilation following complex vascular surgery Patient remains intubated s/p aortobifemoral bypass and aortic endarterectomy of infra-renal and supra-renal aorta. - Continue ventilatory support, wean settings as tolerated. - Sedation with propofol, continue to wean as able. - VAP precautions. - Monitor volume status, will diurese with 33m IV Lasix and monitor response.   CLI and aortoiliac disease, s/p aortic endarterectomy of infra-renal and supra-renal aorta (02/16/2019), c/b acute blood loss, persistently elevated lactic acid Patient received additional 2L IVF overnight for hypotension. Lactic acid remains elevated (7.5  >> 6.1 >> 6.2 >> 6.4), concerning for possible intestinal ischemia. Patient has received significant IVF resuscitation and is now hypervolemia, will initiate diuresis. Distal lower extremity pulses able to be obtained with Doppler.  - Administer one dose of Lasix 846mIV, and monitor response.  - Management per Vascular Surgery. - Neurovascular checks every 4 hours. - Monitor for signs of reperfusion injury. - Trend lactic acid  Intermittent Hypotension. Patient received additional IVF resuscitation overnight on 8/10 for hypotension. She has received sufficient fluid resuscitation, will need to start vasopressors at this time. - Initiate norepinephrine.  Anemia Patient with acute blood loss during surgery, resuscitation as above. Hemoglobin trending down (11.3 >> 7.9 >> 8.4 >> 8.0), likely some dilutional component). No evidence of bleeding on exam. - Trend hemoglobin. - Monitor for signs/symptoms of bleeding.  Acute Kidney Injury, suspect ATN secondary to decreased tissue perfusion given supra-renal clamp during surgery. Cr continues to uptrend (2.60) with BUN of 14. Patient has decreased urine output (~2008mvernight). - Continue maintenance IVFs. - Obtain urine Na, urine Cr, urine osmolality to evaluate for ATN. - Follow BMP and urine output.  Transaminitis, concerning for ischemic hepatitis given supra-celiac clamp during surgery. AST/ALT elevated to 1235/326. Alk phos and T-bili normal. - Continue maintenance IVFs. - Follow CMP.   Hyperkalemia Kayexalate given overnight for K of 5.7. Repeat potassium level is 6.1.  - Lasix 80 IV as above. - Follow BMP.  Hypertension, Diastolic Dysfunction Patient with history of hypertension, recent echo on 02/07/19 that showed EF 55-60% with evidence of diastolic dysfunction. Pre-operative myocardial perfusion scan was normal. Recently started on metroprolol succinate 50m39mily for hypertension and cardiac protection during outpatient  Cardiology evaluation. S/p clevidipine gtt. -  1st line: labetalol IV prn for SBP > 160. - 2nd line: hydralazine IV for SBP > 160. - Metoprolol tartrate IV prn for sustained HR > 130.  Hypomagnesemia -Replete magnesium.  Tobacco Use Disorder, likely COPD Patient has 40 pack-year smoking history. CXR suggestive of COPD, but no formal diagnosis. - Referral for outpatient PFTs. - Smoking cessation support and counseling.  Elevated Blood Glucose Patient with elevated blood glucose, but no known history of diabetes. Repeat Hgb A1c 5.3 - SSI.  Best practice:  Diet: NPO Pain/Anxiety/Delirium protocol (if indicated): Yes VAP protocol (if indicated): Yes DVT prophylaxis: heparin GI prophylaxis: pantoprazole Glucose control: SSI Mobility: Bedrest Code Status: Full code Family Communication: Plan to update patient's daughter Disposition: ICU  Labs   CBC: Recent Labs  Lab 02/15/19 1450  02/16/2019 1622 02/18/2019 1630 03/01/2019 1745 03/02/2019 2229 02/25/2019 2247 02/22/19 0330 02/22/19 0455 02/22/19 1113  WBC 10.5  --  16.4*  --   --  14.2*  --  16.3*  --  22.9*  HGB 12.9   < > 11.3*  --   --  7.9* 6.5* 8.4* 6.1* 8.0*  HCT 38.7   < > 32.3*  --   --  22.8* 19.0* 24.1* 18.0* 23.4*  MCV 100.0  --  93.4  --   --  92.3  --  92.0  --  94.0  PLT 319  --  141* 134* 141* 122*  --  125*  --  113*   < > = values in this interval not displayed.    Basic Metabolic Panel: Recent Labs  Lab 02/15/19 1450  03/12/2019 1153  02/12/2019 1324  02/17/2019 1622 02/22/2019 1630 02/13/2019 2247 02/22/19 0330 02/22/19 0455 02/22/19 1113  NA 136   < > 142   < > 147*   < >  --  148* 148* 145 146* 142  K 4.4   < > 4.8   < > 3.5   < >  --  3.4* 4.7 5.7* 5.2* 6.1*  CL 105  --   --   --   --   --   --  107  --  110  --  114*  CO2 20*  --   --   --   --   --   --  25  --  20*  --  18*  GLUCOSE 110*  --  675*  --  227*  --   --  214*  --  163*  --  183*  BUN 5*  --   --   --   --   --   --  6  --  11  --  14   CREATININE 0.88  --   --   --   --   --  1.32* 1.43*  --  2.25*  --  2.60*  CALCIUM 8.6*  --   --   --   --   --   --  9.1  --  7.5*  --  7.1*  MG  --   --   --   --   --   --   --   --   --  1.3*  --  1.6*  PHOS  --   --   --   --   --   --   --  5.2*  --   --   --   --    < > = values in this interval not displayed.  GFR: CrCl cannot be calculated (Unknown ideal weight.). Recent Labs  Lab 03/06/2019 1622 02/20/2019 1745 02/20/2019 2229 02/22/19 0330 02/22/19 1113 02/22/19 1125  WBC 16.4*  --  14.2* 16.3* 22.9*  --   LATICACIDVEN  --  7.5* 6.1* 6.2*  --  6.4*    Liver Function Tests: Recent Labs  Lab 02/15/19 1450 02/15/2019 1630 02/22/19 0330 02/22/19 1113  AST 17  --  554* 1,235*  ALT 12  --  242* 326*  ALKPHOS 114  --  39 51  BILITOT 0.4  --  0.5 0.5  PROT 6.0*  --  3.1* 3.0*  ALBUMIN 3.4* 2.5* 2.0* 1.9*   Recent Labs  Lab 02/22/19 0330  AMYLASE 121*   No results for input(s): AMMONIA in the last 168 hours.  ABG    Component Value Date/Time   PHART 7.355 02/22/2019 0455   PCO2ART 38.1 02/22/2019 0455   PO2ART 78.0 (L) 02/22/2019 0455   HCO3 21.3 02/22/2019 0455   TCO2 22 02/22/2019 0455   ACIDBASEDEF 4.0 (H) 02/22/2019 0455   O2SAT 95.0 02/22/2019 0455     Coagulation Profile: Recent Labs  Lab 02/15/19 1450 02/14/2019 1630 02/14/2019 1745  INR 1.0 SPECIMEN HEMOLYZED. HEMOLYSIS MAY AFFECT INTEGRITY OF RESULTS. 1.6*    Cardiac Enzymes: No results for input(s): CKTOTAL, CKMB, CKMBINDEX, TROPONINI in the last 168 hours.  HbA1C: Hgb A1c MFr Bld  Date/Time Value Ref Range Status  02/19/2019 05:30 PM 5.3 4.8 - 5.6 % Final    Comment:    REPEATED TO VERIFY (NOTE) Pre diabetes:          5.7%-6.4% Diabetes:              >6.4% Glycemic control for   <7.0% adults with diabetes   12/09/2013 10:27 AM 5.5 <5.7 % Final    Comment:                                                                          According to the ADA Clinical Practice Recommendations for  2011, whenHbA1c is used as a screening test:   >=6.5%   Diagnostic of Diabetes Mellitus           (if abnormal result is  confirmed) 5.7-6.4%   Increased risk of developing Diabetes Mellitus References:Diagnosis and Classification of Diabetes Mellitus,DiabetesCare,2011,34(Suppl 1):S62-S69 and Standards of Medical Care in        Diabetes - 2011,Diabetes Care,2011,34 (Suppl  1):S11-S61.     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   Surgical History    Past Surgical History:  Procedure Laterality Date  . ABDOMINAL HYSTERECTOMY    . CARDIAC VALVE SURGERY    . COLONOSCOPY    . EYE SURGERY       Social History   reports that she has been smoking cigarettes. She has a 20.00 pack-year smoking history. She has never  used smokeless tobacco. She reports that she does not drink alcohol or use drugs.   Family History   Her family history includes Cancer in her mother; Colon cancer in her paternal aunt; Esophageal cancer in her maternal grandfather; Heart attack in her brother; Heart disease in her brother; Hypertension in her mother and sister; Stroke in her maternal uncle and mother. There is no history of Rectal cancer or Stomach cancer.   Allergies No Known Allergies   Home Medications  Prior to Admission medications   Medication Sig Start Date End Date Taking? Authorizing Provider  aspirin EC 325 MG tablet Take 325 mg by mouth daily.   Yes [provider]  atorvastatin (LIPITOR) 40 MG tablet Take 1 tablet (40 mg total) by mouth daily. 02/03/19 05/04/19 Yes Miquel Dunn, NP   butalbital-acetaminophen-caffeine (FIORICET) [phone] MG per tablet Take 1 tablet by mouth 2 (two) times daily as needed for headache. 02/21/14  Yes Advani, Vernon Prey, MD  clopidogrel (PLAVIX) 75 MG tablet Take 75 mg by mouth daily.    Yes [provider]  DULoxetine (CYMBALTA) 30 MG capsule Take 30 mg by mouth 2 (two) times daily.   Yes [provider]  lisinopril (PRINIVIL,ZESTRIL) 20 MG tablet Take 20 mg by mouth daily.   Yes [provider]  loratadine (CLARITIN) 10 MG tablet Take 1 tablet (10 mg total) by mouth daily. 01/23/14  Yes Advani, Vernon Prey, MD  metoprolol succinate (TOPROL XL) 25 MG 24 hr tablet Take 1 tablet (25 mg total) by mouth daily. 02/03/19  Yes Miquel Dunn, NP  potassium chloride (MICRO-K) 10 MEQ CR capsule Take 10 mEq by mouth daily.   Yes [provider]  OVER THE COUNTER MEDICATION Apply 1 application topically daily. Theraworx topical cream    [provider]     Atilano Ina, MS4

## 2019-02-22 NOTE — Progress Notes (Signed)
RN attempted x2 to initiate CRRT. Both times patient's blood pressure dropped with systolic BP in the 71'T, patient had a femoral pulse. Pressors maximized, sedation held. CCM notified. See new orders from Dr. Emmit Alexanders. ABG checked, pt acidotic. 2 amps of bicarb given.   Nephrology Dr. Augustin Coupe paged.  Aware of events. Says we can try again if MAP >60, however, if pt's BP drops significantly again, to hold CRRTuntil the morning.

## 2019-02-22 NOTE — Progress Notes (Signed)
eLink Physician-Brief Progress Note Patient Name: JOYCELYNN FRITSCHE DOB: 06-09-1961 MRN: [id number]   Date of Service  02/22/2019  HPI/Events of Note  Multiple issues: 1. K+ = 5.7, Mg++ = 1.3 and Creatinine = 2.25 and 2. Ca++ = 7.5 which corrects to 9.1 (normal) given albumin = 2.0. QRS not widened or T wave peaked on bedside monitor.   eICU Interventions  Will order: 1. Replace Mg++. 2. Kayexalate 30 gm per tube now.  3. Repeat BMP and Mg++ level at 12 noon.      Intervention Category Major Interventions: Electrolyte abnormality - evaluation and management  Shubham Thackston [place] 02/22/2019, 5:33 AM

## 2019-02-23 ENCOUNTER — Encounter (HOSPITAL_COMMUNITY): Payer: Self-pay | Admitting: Certified Registered Nurse Anesthetist

## 2019-02-23 ENCOUNTER — Inpatient Hospital Stay (HOSPITAL_COMMUNITY): Payer: Medicaid Other

## 2019-02-23 ENCOUNTER — Inpatient Hospital Stay (HOSPITAL_COMMUNITY): Payer: Medicaid Other | Admitting: Certified Registered Nurse Anesthetist

## 2019-02-23 ENCOUNTER — Encounter (HOSPITAL_COMMUNITY): Admission: RE | Disposition: E | Payer: Self-pay | Source: Home / Self Care | Attending: Vascular Surgery

## 2019-02-23 DIAGNOSIS — I959 Hypotension, unspecified: Secondary | ICD-10-CM

## 2019-02-23 DIAGNOSIS — R0682 Tachypnea, not elsewhere classified: Secondary | ICD-10-CM

## 2019-02-23 HISTORY — PX: ABDOMINAL AORTIC ANEURYSM REPAIR: SHX42

## 2019-02-23 HISTORY — PX: APPLICATION OF WOUND VAC: SHX5189

## 2019-02-23 LAB — POCT I-STAT, CHEM 8
BUN: 19 mg/dL (ref 6–20)
BUN: 17 mg/dL (ref 6–20)
Calcium, Ion: 0.89 mmol/L — CL (ref 1.15–1.40)
Calcium, Ion: 1.07 mmol/L — ABNORMAL LOW (ref 1.15–1.40)
Chloride: 112 mmol/L — ABNORMAL HIGH (ref 98–111)
Chloride: 109 mmol/L (ref 98–111)
Creatinine, Ser: 3.3 mg/dL — ABNORMAL HIGH (ref 0.44–1.00)
Creatinine, Ser: 3.1 mg/dL — ABNORMAL HIGH (ref 0.44–1.00)
Glucose, Bld: 76 mg/dL (ref 70–99)
Glucose, Bld: 79 mg/dL (ref 70–99)
HCT: 25 % — ABNORMAL LOW (ref 36.0–46.0)
HCT: 26 % — ABNORMAL LOW (ref 36.0–46.0)
Hemoglobin: 8.5 g/dL — ABNORMAL LOW (ref 12.0–15.0)
Hemoglobin: 8.8 g/dL — ABNORMAL LOW (ref 12.0–15.0)
Potassium: 5.3 mmol/L — ABNORMAL HIGH (ref 3.5–5.1)
Potassium: 5.2 mmol/L — ABNORMAL HIGH (ref 3.5–5.1)
Sodium: 145 mmol/L (ref 135–145)
Sodium: 146 mmol/L — ABNORMAL HIGH (ref 135–145)
TCO2: 12 mmol/L — ABNORMAL LOW (ref 22–32)
TCO2: 15 mmol/L — ABNORMAL LOW (ref 22–32)

## 2019-02-23 LAB — COMPREHENSIVE METABOLIC PANEL
ALT: 1018 U/L — ABNORMAL HIGH (ref 0–44)
AST: 4508 U/L — ABNORMAL HIGH (ref 15–41)
Albumin: 1.9 g/dL — ABNORMAL LOW (ref 3.5–5.0)
Alkaline Phosphatase: 101 U/L (ref 38–126)
Anion gap: 19 — ABNORMAL HIGH (ref 5–15)
BUN: 16 mg/dL (ref 6–20)
CO2: 17 mmol/L — ABNORMAL LOW (ref 22–32)
Calcium: 6.6 mg/dL — ABNORMAL LOW (ref 8.9–10.3)
Chloride: 111 mmol/L (ref 98–111)
Creatinine, Ser: 3.3 mg/dL — ABNORMAL HIGH (ref 0.44–1.00)
GFR calc Af Amer: 17 mL/min — ABNORMAL LOW (ref >60)
GFR calc non Af Amer: 15 mL/min — ABNORMAL LOW (ref >60)
Glucose, Bld: 50 mg/dL — ABNORMAL LOW (ref 70–99)
Potassium: 6.1 mmol/L — ABNORMAL HIGH (ref 3.5–5.1)
Sodium: 147 mmol/L — ABNORMAL HIGH (ref 135–145)
Total Bilirubin: 1 mg/dL (ref 0.3–1.2)
Total Protein: 3.2 g/dL — ABNORMAL LOW (ref 6.5–8.1)

## 2019-02-23 LAB — GLUCOSE, CAPILLARY
Glucose-Capillary: 125 mg/dL — ABNORMAL HIGH (ref 70–99)
Glucose-Capillary: 140 mg/dL — ABNORMAL HIGH (ref 70–99)
Glucose-Capillary: 146 mg/dL — ABNORMAL HIGH (ref 70–99)
Glucose-Capillary: 42 mg/dL — CL (ref 70–99)
Glucose-Capillary: 49 mg/dL — ABNORMAL LOW (ref 70–99)
Glucose-Capillary: 74 mg/dL (ref 70–99)
Glucose-Capillary: 81 mg/dL (ref 70–99)
Glucose-Capillary: 85 mg/dL (ref 70–99)
Glucose-Capillary: 98 mg/dL (ref 70–99)

## 2019-02-23 LAB — PROTIME-INR
INR: 9.7 (ref 0.8–1.2)
INR: 3.9 — ABNORMAL HIGH (ref 0.8–1.2)
Prothrombin Time: 76.3 seconds — ABNORMAL HIGH (ref 11.4–15.2)
Prothrombin Time: 37.3 seconds — ABNORMAL HIGH (ref 11.4–15.2)

## 2019-02-23 LAB — POCT I-STAT 7, (LYTES, BLD GAS, ICA,H+H)
Acid-base deficit: 7 mmol/L — ABNORMAL HIGH (ref 0.0–2.0)
Acid-base deficit: 9 mmol/L — ABNORMAL HIGH (ref 0.0–2.0)
Acid-base deficit: 20 mmol/L — ABNORMAL HIGH (ref 0.0–2.0)
Acid-base deficit: 20 mmol/L — ABNORMAL HIGH (ref 0.0–2.0)
Acid-base deficit: 15 mmol/L — ABNORMAL HIGH (ref 0.0–2.0)
Bicarbonate: 19.4 mmol/L — ABNORMAL LOW (ref 20.0–28.0)
Bicarbonate: 16.2 mmol/L — ABNORMAL LOW (ref 20.0–28.0)
Bicarbonate: 10.3 mmol/L — ABNORMAL LOW (ref 20.0–28.0)
Bicarbonate: 9.4 mmol/L — ABNORMAL LOW (ref 20.0–28.0)
Bicarbonate: 13.4 mmol/L — ABNORMAL LOW (ref 20.0–28.0)
Calcium, Ion: 0.93 mmol/L — ABNORMAL LOW (ref 1.15–1.40)
Calcium, Ion: 0.87 mmol/L — CL (ref 1.15–1.40)
Calcium, Ion: 0.76 mmol/L — CL (ref 1.15–1.40)
Calcium, Ion: 0.9 mmol/L — ABNORMAL LOW (ref 1.15–1.40)
Calcium, Ion: 1.08 mmol/L — ABNORMAL LOW (ref 1.15–1.40)
HCT: 27 % — ABNORMAL LOW (ref 36.0–46.0)
HCT: 30 % — ABNORMAL LOW (ref 36.0–46.0)
HCT: 19 % — ABNORMAL LOW (ref 36.0–46.0)
HCT: 25 % — ABNORMAL LOW (ref 36.0–46.0)
HCT: 27 % — ABNORMAL LOW (ref 36.0–46.0)
Hemoglobin: 9.2 g/dL — ABNORMAL LOW (ref 12.0–15.0)
Hemoglobin: 10.2 g/dL — ABNORMAL LOW (ref 12.0–15.0)
Hemoglobin: 6.5 g/dL — CL (ref 12.0–15.0)
Hemoglobin: 8.5 g/dL — ABNORMAL LOW (ref 12.0–15.0)
Hemoglobin: 9.2 g/dL — ABNORMAL LOW (ref 12.0–15.0)
O2 Saturation: 86 %
O2 Saturation: 87 %
O2 Saturation: 97 %
O2 Saturation: 88 %
O2 Saturation: 82 %
Patient temperature: 37.5
Patient temperature: 36.9
Patient temperature: 35.1
Patient temperature: 33.9
Patient temperature: 34.1
Potassium: 5.5 mmol/L — ABNORMAL HIGH (ref 3.5–5.1)
Potassium: 5.1 mmol/L (ref 3.5–5.1)
Potassium: 5.5 mmol/L — ABNORMAL HIGH (ref 3.5–5.1)
Potassium: 5.4 mmol/L — ABNORMAL HIGH (ref 3.5–5.1)
Potassium: 5.2 mmol/L — ABNORMAL HIGH (ref 3.5–5.1)
Sodium: 147 mmol/L — ABNORMAL HIGH (ref 135–145)
Sodium: 147 mmol/L — ABNORMAL HIGH (ref 135–145)
Sodium: 146 mmol/L — ABNORMAL HIGH (ref 135–145)
Sodium: 147 mmol/L — ABNORMAL HIGH (ref 135–145)
Sodium: 149 mmol/L — ABNORMAL HIGH (ref 135–145)
TCO2: 21 mmol/L — ABNORMAL LOW (ref 22–32)
TCO2: 17 mmol/L — ABNORMAL LOW (ref 22–32)
TCO2: 12 mmol/L — ABNORMAL LOW (ref 22–32)
TCO2: 10 mmol/L — ABNORMAL LOW (ref 22–32)
TCO2: 15 mmol/L — ABNORMAL LOW (ref 22–32)
pCO2 arterial: 41.9 mmHg (ref 32.0–48.0)
pCO2 arterial: 32.1 mmHg (ref 32.0–48.0)
pCO2 arterial: 41.3 mmHg (ref 32.0–48.0)
pCO2 arterial: 31.4 mmHg — ABNORMAL LOW (ref 32.0–48.0)
pCO2 arterial: 34.1 mmHg (ref 32.0–48.0)
pH, Arterial: 7.277 — ABNORMAL LOW (ref 7.350–7.450)
pH, Arterial: 7.311 — ABNORMAL LOW (ref 7.350–7.450)
pH, Arterial: 6.993 — CL (ref 7.350–7.450)
pH, Arterial: 7.064 — CL (ref 7.350–7.450)
pH, Arterial: 7.184 — CL (ref 7.350–7.450)
pO2, Arterial: 60 mmHg — ABNORMAL LOW (ref 83.0–108.0)
pO2, Arterial: 56 mmHg — ABNORMAL LOW (ref 83.0–108.0)
pO2, Arterial: 136 mmHg — ABNORMAL HIGH (ref 83.0–108.0)
pO2, Arterial: 65 mmHg — ABNORMAL LOW (ref 83.0–108.0)
pO2, Arterial: 49 mmHg — ABNORMAL LOW (ref 83.0–108.0)

## 2019-02-23 LAB — CBC
HCT: 24.1 % — ABNORMAL LOW (ref 36.0–46.0)
HCT: 23.4 % — ABNORMAL LOW (ref 36.0–46.0)
HCT: 37.6 % (ref 36.0–46.0)
Hemoglobin: 8.4 g/dL — ABNORMAL LOW (ref 12.0–15.0)
Hemoglobin: 8 g/dL — ABNORMAL LOW (ref 12.0–15.0)
Hemoglobin: 12.9 g/dL (ref 12.0–15.0)
MCH: 32.1 pg (ref 26.0–34.0)
MCH: 32.1 pg (ref 26.0–34.0)
MCH: 31.1 pg (ref 26.0–34.0)
MCHC: 34.9 g/dL (ref 30.0–36.0)
MCHC: 34.2 g/dL (ref 30.0–36.0)
MCHC: 34.3 g/dL (ref 30.0–36.0)
MCV: 92 fL (ref 80.0–100.0)
MCV: 94 fL (ref 80.0–100.0)
MCV: 90.6 fL (ref 80.0–100.0)
Platelets: 125 10*3/uL — ABNORMAL LOW (ref 150–400)
Platelets: 113 10*3/uL — ABNORMAL LOW (ref 150–400)
Platelets: 75 10*3/uL — ABNORMAL LOW (ref 150–400)
RBC: 2.62 MIL/uL — ABNORMAL LOW (ref 3.87–5.11)
RBC: 2.49 MIL/uL — ABNORMAL LOW (ref 3.87–5.11)
RBC: 4.15 MIL/uL (ref 3.87–5.11)
RDW: 15.9 % — ABNORMAL HIGH (ref 11.5–15.5)
RDW: 16.1 % — ABNORMAL HIGH (ref 11.5–15.5)
RDW: 15.1 % (ref 11.5–15.5)
WBC: 16.3 10*3/uL — ABNORMAL HIGH (ref 4.0–10.5)
WBC: 22.9 10*3/uL — ABNORMAL HIGH (ref 4.0–10.5)
WBC: 27.2 10*3/uL — ABNORMAL HIGH (ref 4.0–10.5)
nRBC: 0 % (ref 0.0–0.2)
nRBC: 0 % (ref 0.0–0.2)
nRBC: 0.7 % — ABNORMAL HIGH (ref 0.0–0.2)

## 2019-02-23 LAB — RENAL FUNCTION PANEL
Albumin: 1.7 g/dL — ABNORMAL LOW (ref 3.5–5.0)
Anion gap: 23 — ABNORMAL HIGH (ref 5–15)
BUN: 15 mg/dL (ref 6–20)
CO2: 14 mmol/L — ABNORMAL LOW (ref 22–32)
Calcium: 7.9 mg/dL — ABNORMAL LOW (ref 8.9–10.3)
Chloride: 111 mmol/L (ref 98–111)
Creatinine, Ser: 3.25 mg/dL — ABNORMAL HIGH (ref 0.44–1.00)
GFR calc Af Amer: 17 mL/min — ABNORMAL LOW (ref >60)
GFR calc non Af Amer: 15 mL/min — ABNORMAL LOW (ref >60)
Glucose, Bld: 83 mg/dL (ref 70–99)
Phosphorus: 8.4 mg/dL — ABNORMAL HIGH (ref 2.5–4.6)
Potassium: 5.2 mmol/L — ABNORMAL HIGH (ref 3.5–5.1)
Sodium: 148 mmol/L — ABNORMAL HIGH (ref 135–145)

## 2019-02-23 LAB — MAGNESIUM: Magnesium: 1.5 mg/dL — ABNORMAL LOW (ref 1.7–2.4)

## 2019-02-23 LAB — LACTIC ACID, PLASMA
Lactic Acid, Venous: 9.3 mmol/L (ref 0.5–1.9)
Lactic Acid, Venous: 9.4 mmol/L (ref 0.5–1.9)
Lactic Acid, Venous: >11 mmol/L (ref 0.5–1.9)
Lactic Acid, Venous: >11 mmol/L (ref 0.5–1.9)

## 2019-02-23 LAB — COOXEMETRY PANEL
Carboxyhemoglobin: 1.2 % (ref 0.5–1.5)
Methemoglobin: 1.5 % (ref 0.0–1.5)
O2 Saturation: 61.7 %
Total hemoglobin: 12.2 g/dL (ref 12.0–16.0)

## 2019-02-23 LAB — HEPATIC FUNCTION PANEL
ALT: 1370 U/L — ABNORMAL HIGH (ref 0–44)
AST: >10000 U/L — ABNORMAL HIGH (ref 15–41)
Albumin: 2.3 g/dL — ABNORMAL LOW (ref 3.5–5.0)
Alkaline Phosphatase: 175 U/L — ABNORMAL HIGH (ref 38–126)
Bilirubin, Direct: 1.1 mg/dL — ABNORMAL HIGH (ref 0.0–0.2)
Indirect Bilirubin: 0.9 mg/dL (ref 0.3–0.9)
Total Bilirubin: 2 mg/dL — ABNORMAL HIGH (ref 0.3–1.2)
Total Protein: 3.6 g/dL — ABNORMAL LOW (ref 6.5–8.1)

## 2019-02-23 LAB — PREPARE RBC (CROSSMATCH)

## 2019-02-23 LAB — POTASSIUM: Potassium: 5.5 mmol/L — ABNORMAL HIGH (ref 3.5–5.1)

## 2019-02-23 LAB — PHOSPHORUS: Phosphorus: 6.3 mg/dL — ABNORMAL HIGH (ref 2.5–4.6)

## 2019-02-23 LAB — TRIGLYCERIDES: Triglycerides: 81 mg/dL (ref <150)

## 2019-02-23 SURGERY — ANEURYSM ABDOMINAL AORTIC REPAIR
Anesthesia: General | Site: Abdomen

## 2019-02-23 MED ORDER — CALCIUM CHLORIDE 10 % IV SOLN
2.0000 g | Freq: Once | INTRAVENOUS | Status: AC
  Administered 2019-02-23: 14:00:00 2 g via INTRAVENOUS

## 2019-02-23 MED ORDER — PHENYLEPHRINE HCL-NACL 40-0.9 MG/250ML-% IV SOLN
0.0000 ug/min | INTRAVENOUS | Status: DC
  Administered 2019-02-23: 09:00:00 20 ug/min via INTRAVENOUS
  Administered 2019-02-23 – 2019-02-24 (×2): 100 ug/min via INTRAVENOUS
  Administered 2019-02-24: 14:00:00 150.133 ug/min via INTRAVENOUS
  Administered 2019-02-24: 08:00:00 110 ug/min via INTRAVENOUS
  Filled 2019-02-23 (×9): qty 250

## 2019-02-23 MED ORDER — ALBUMIN HUMAN 5 % IV SOLN
INTRAVENOUS | Status: DC | PRN
  Administered 2019-02-23: 12:00:00 via INTRAVENOUS

## 2019-02-23 MED ORDER — CALCIUM CHLORIDE 10 % IV SOLN
INTRAVENOUS | Status: DC | PRN
  Administered 2019-02-23: 500 mg via INTRAVENOUS
  Administered 2019-02-23 (×2): 250 mg via INTRAVENOUS

## 2019-02-23 MED ORDER — SODIUM CHLORIDE 0.9 % IV SOLN: 10.0000 mL/h | Freq: Once | INTRAVENOUS | Status: DC

## 2019-02-23 MED ORDER — MAGNESIUM SULFATE 2 GM/50ML IV SOLN
2.0000 g | Freq: Once | INTRAVENOUS | Status: AC
  Administered 2019-02-23: 08:00:00 2 g via INTRAVENOUS
  Filled 2019-02-23: qty 50

## 2019-02-23 MED ORDER — ROCURONIUM BROMIDE 50 MG/5ML IV SOSY
PREFILLED_SYRINGE | INTRAVENOUS | Status: DC | PRN
  Administered 2019-02-23: 40 mg via INTRAVENOUS
  Administered 2019-02-23: 50 mg via INTRAVENOUS

## 2019-02-23 MED ORDER — SODIUM CHLORIDE 0.9 % IV SOLN
INTRAVENOUS | Status: DC | PRN
  Administered 2019-02-23: 12:00:00 via INTRAVENOUS

## 2019-02-23 MED ORDER — METRONIDAZOLE IN NACL 5-0.79 MG/ML-% IV SOLN
500.0000 mg | Freq: Three times a day (TID) | INTRAVENOUS | Status: DC
  Administered 2019-02-23 – 2019-02-24 (×4): 500 mg via INTRAVENOUS
  Filled 2019-02-23 (×4): qty 100

## 2019-02-23 MED ORDER — SODIUM CHLORIDE 0.9 % IV SOLN
INTRAVENOUS | Status: DC | PRN
  Administered 2019-02-23: 12:00:00 500 mL

## 2019-02-23 MED ORDER — CALCIUM CHLORIDE 10 % IV SOLN
INTRAVENOUS | Status: AC
  Filled 2019-02-23: qty 10

## 2019-02-23 MED ORDER — SODIUM CHLORIDE 0.9 % IV SOLN
INTRAVENOUS | Status: AC
  Filled 2019-02-23: qty 1.2

## 2019-02-23 MED ORDER — SODIUM BICARBONATE 8.4 % IV SOLN
INTRAVENOUS | Status: AC
  Administered 2019-02-23: 14:00:00 50 meq
  Filled 2019-02-23: qty 50

## 2019-02-23 MED ORDER — 0.9 % SODIUM CHLORIDE (POUR BTL) OPTIME
TOPICAL | Status: DC | PRN
  Administered 2019-02-23: 1000 mL

## 2019-02-23 MED ORDER — VITAMIN K1 10 MG/ML IJ SOLN
5.0000 mg | Freq: Once | INTRAVENOUS | Status: AC
  Administered 2019-02-23: 17:00:00 5 mg via INTRAVENOUS
  Filled 2019-02-23: qty 0.5

## 2019-02-23 MED ORDER — SODIUM POLYSTYRENE SULFONATE 15 GM/60ML PO SUSP
30.0000 g | Freq: Once | ORAL | Status: DC
  Filled 2019-02-23: qty 120

## 2019-02-23 MED ORDER — DEXTROSE-NACL 5-0.45 % IV SOLN
INTRAVENOUS | Status: DC
  Administered 2019-02-23 (×2): via INTRAVENOUS

## 2019-02-23 MED ORDER — HEMOSTATIC AGENTS (NO CHARGE) OPTIME
TOPICAL | Status: DC | PRN
  Administered 2019-02-23: 6 via TOPICAL

## 2019-02-23 MED ORDER — HYDROCORTISONE NA SUCCINATE PF 100 MG IJ SOLR
50.0000 mg | Freq: Four times a day (QID) | INTRAMUSCULAR | Status: DC
  Administered 2019-02-23 – 2019-02-24 (×5): 50 mg via INTRAVENOUS
  Filled 2019-02-23 (×5): qty 2

## 2019-02-23 MED ORDER — SODIUM BICARBONATE 8.4 % IV SOLN
INTRAVENOUS | Status: AC
  Filled 2019-02-23: qty 50

## 2019-02-23 MED ORDER — HYDROCORTISONE NA SUCCINATE PF 100 MG IJ SOLR: 100.0000 mg | Freq: Four times a day (QID) | INTRAMUSCULAR | Status: DC

## 2019-02-23 MED ORDER — CALCIUM CHLORIDE 10 % IV SOLN
INTRAVENOUS | Status: AC
  Administered 2019-02-23: 14:00:00 2 g via INTRAVENOUS
  Filled 2019-02-23: qty 20

## 2019-02-23 MED ORDER — SODIUM CHLORIDE 0.9% FLUSH: 10.0000 mL | Freq: Two times a day (BID) | INTRAVENOUS | Status: DC

## 2019-02-23 MED ORDER — INSULIN ASPART 100 UNIT/ML ~~LOC~~ SOLN: 0.0000 [IU] | Freq: Three times a day (TID) | SUBCUTANEOUS | Status: DC

## 2019-02-23 MED ORDER — CHLORHEXIDINE GLUCONATE CLOTH 2 % EX PADS
6.0000 | MEDICATED_PAD | Freq: Every day | CUTANEOUS | Status: DC
  Administered 2019-02-23 – 2019-02-24 (×2): 6 via TOPICAL

## 2019-02-23 MED ORDER — SODIUM CHLORIDE 0.9% FLUSH: 10.0000 mL | INTRAVENOUS | Status: DC | PRN

## 2019-02-23 MED ORDER — SODIUM CHLORIDE 0.9 % IV SOLN
1.5000 g | INTRAVENOUS | Status: DC
  Filled 2019-02-23: qty 1.5

## 2019-02-23 MED ORDER — SODIUM CHLORIDE 0.9% IV SOLUTION: Freq: Once | INTRAVENOUS | Status: AC

## 2019-02-23 MED ORDER — CALCIUM GLUCONATE-NACL 2-0.675 GM/100ML-% IV SOLN
2.0000 g | Freq: Once | INTRAVENOUS | Status: AC
  Administered 2019-02-23: 18:00:00 2000 mg via INTRAVENOUS
  Filled 2019-02-23: qty 100

## 2019-02-23 MED ORDER — SODIUM BICARBONATE 8.4 % IV SOLN
INTRAVENOUS | Status: DC | PRN
  Administered 2019-02-23: 50 meq via INTRAVENOUS

## 2019-02-23 MED ORDER — DEXTROSE 50 % IV SOLN
50.0000 mL | Freq: Once | INTRAVENOUS | Status: AC
  Administered 2019-02-23: 04:00:00 50 mL via INTRAVENOUS

## 2019-02-23 MED ORDER — SODIUM CHLORIDE 0.9 % IV SOLN
2.0000 g | INTRAVENOUS | Status: DC
  Administered 2019-02-23 – 2019-02-24 (×2): 2 g via INTRAVENOUS
  Filled 2019-02-23 (×2): qty 2

## 2019-02-23 MED ORDER — ROCURONIUM BROMIDE 10 MG/ML (PF) SYRINGE
PREFILLED_SYRINGE | INTRAVENOUS | Status: AC
  Filled 2019-02-23: qty 10

## 2019-02-23 MED ORDER — DEXTROSE 50 % IV SOLN
50.0000 mL | Freq: Once | INTRAVENOUS | Status: AC
  Administered 2019-02-23: 21:00:00 50 mL via INTRAVENOUS
  Filled 2019-02-23: qty 50

## 2019-02-23 MED ORDER — DEXTROSE 10 % IV SOLN
INTRAVENOUS | Status: DC
  Administered 2019-02-23 – 2019-02-24 (×2): via INTRAVENOUS

## 2019-02-23 MED ORDER — CALCIUM CHLORIDE 10 % IV SOLN: 2.0000 g | Freq: Once | INTRAVENOUS | Status: DC

## 2019-02-23 MED ORDER — SODIUM CHLORIDE 0.9 % IV SOLN: 2.0000 g | Freq: Once | INTRAVENOUS | Status: DC

## 2019-02-23 SURGICAL SUPPLY — 51 items
AGENT HMST SPONGE THK3/8 (HEMOSTASIS)
CANISTER SUCT 3000ML PPV (MISCELLANEOUS) ×3 IMPLANT
CLIP VESOCCLUDE MED 24/CT (CLIP) ×3 IMPLANT
CLIP VESOCCLUDE SM WIDE 24/CT (CLIP) ×3 IMPLANT
CONNECTOR Y ATS VAC SYSTEM (MISCELLANEOUS) ×2 IMPLANT
COVER WAND RF STERILE (DRAPES) ×3 IMPLANT
DRSG COVADERM 4X14 (GAUZE/BANDAGES/DRESSINGS) ×2 IMPLANT
ELECT BLADE 4.0 EZ CLEAN MEGAD (MISCELLANEOUS) ×3
ELECT BLADE 6.5 EXT (BLADE) IMPLANT
ELECT REM PT RETURN 9FT ADLT (ELECTROSURGICAL) ×3
ELECTRODE BLDE 4.0 EZ CLN MEGD (MISCELLANEOUS) ×1 IMPLANT
ELECTRODE REM PT RTRN 9FT ADLT (ELECTROSURGICAL) ×1 IMPLANT
FELT TEFLON 1X6 (MISCELLANEOUS) ×3 IMPLANT
GLOVE BIO SURGEON STRL SZ7.5 (GLOVE) ×3 IMPLANT
GLOVE BIOGEL PI IND STRL 8 (GLOVE) ×1 IMPLANT
GLOVE BIOGEL PI INDICATOR 8 (GLOVE) ×2
GOWN STRL REUS W/ TWL LRG LVL3 (GOWN DISPOSABLE) ×3 IMPLANT
GOWN STRL REUS W/ TWL XL LVL3 (GOWN DISPOSABLE) ×1 IMPLANT
GOWN STRL REUS W/TWL LRG LVL3 (GOWN DISPOSABLE) ×9
GOWN STRL REUS W/TWL XL LVL3 (GOWN DISPOSABLE) ×3
HEMOSTAT SPONGE AVITENE ULTRA (HEMOSTASIS) IMPLANT
INSERT FOGARTY 61MM (MISCELLANEOUS) ×6 IMPLANT
INSERT FOGARTY SM (MISCELLANEOUS) ×10 IMPLANT
KIT BASIN OR (CUSTOM PROCEDURE TRAY) ×3 IMPLANT
KIT PREVENA INCISION MGT 13 (CANNISTER) ×4 IMPLANT
KIT TURNOVER KIT B (KITS) ×3 IMPLANT
NS IRRIG 1000ML POUR BTL (IV SOLUTION) ×6 IMPLANT
PACK AORTA (CUSTOM PROCEDURE TRAY) ×3 IMPLANT
PAD ARMBOARD 7.5X6 YLW CONV (MISCELLANEOUS) ×6 IMPLANT
RETAINER VISCERA MED (MISCELLANEOUS) ×3 IMPLANT
STAPLER VISISTAT 35W (STAPLE) ×6 IMPLANT
SURGICEL SNOW 2X4 (HEMOSTASIS) ×10 IMPLANT
SUT ETHIBOND 5 LR DA (SUTURE) IMPLANT
SUT PDS AB 1 TP1 96 (SUTURE) ×6 IMPLANT
SUT PROLENE 3 0 SH1 36 (SUTURE) ×3 IMPLANT
SUT PROLENE 5 0 C 1 24 (SUTURE) IMPLANT
SUT PROLENE 5 0 C 1 36 (SUTURE) IMPLANT
SUT SILK 2 0 (SUTURE) ×3
SUT SILK 2 0 SH CR/8 (SUTURE) ×1 IMPLANT
SUT SILK 2-0 18XBRD TIE 12 (SUTURE) IMPLANT
SUT SILK 3 0 (SUTURE) ×3
SUT SILK 3-0 18XBRD TIE 12 (SUTURE) IMPLANT
SUT SILK 4 0 (SUTURE) ×3
SUT SILK 4-0 18XBRD TIE 12 (SUTURE) IMPLANT
SUT VIC AB 2-0 CT1 36 (SUTURE) ×8 IMPLANT
SUT VIC AB 3-0 SH 27 (SUTURE) ×6
SUT VIC AB 3-0 SH 27X BRD (SUTURE) IMPLANT
TOWEL GREEN STERILE (TOWEL DISPOSABLE) ×3 IMPLANT
TOWEL SURG RFD BLUE STRL DISP (DISPOSABLE) ×6 IMPLANT
TRAY FOLEY MTR SLVR 16FR STAT (SET/KITS/TRAYS/PACK) ×1 IMPLANT
WATER STERILE IRR 1000ML POUR (IV SOLUTION) ×4 IMPLANT

## 2019-02-23 NOTE — Progress Notes (Signed)
CRITICAL VALUE ALERT  Critical Value:  Lactic acid >11  Date & Time Notified:  03/08/2019 1916  Provider Notified: Dr. Lynetta Mare aware.  Orders Received/Actions taken: Within previous range.  Joellen Jersey, RN

## 2019-02-23 NOTE — Anesthesia Preprocedure Evaluation (Addendum)
Anesthesia Evaluation  Patient identified by MRN, date of birth, ID band Patient awake    Reviewed: Allergy & Precautions, NPO status , Patient's Chart, lab work & pertinent test results  History of Anesthesia Complications Negative for: history of anesthetic complications  Airway Mallampati: Intubated       Dental   Pulmonary Current Smoker and Patient abstained from smoking.,    + rhonchi        Cardiovascular hypertension, + CAD and + DVT   Rhythm:Regular Rate:Tachycardia  IMPRESSIONS    1. The left ventricle has normal systolic function, with an ejection fraction of 55-60%. The cavity size was normal. Left ventricular diastolic Doppler parameters are consistent with impaired relaxation.  2. The right ventricle has normal systolic function. The cavity was normal.  3. The mitral valve is grossly normal.  4. The tricuspid valve is grossly normal.  5. The aortic valve is tricuspid. No stenosis of the aortic valve.  6. The aorta is normal in size and structure.  7. Normal LV systolic function; mild diastolic dysfunction.  Narrative Lexiscan Myoview Stress Test 02/07/2019: Stress EKG is non-diagnostic, as this is pharmacological stress test.  Myocardial perfusion imaging is normal. Left ventricular ejection fraction  is 64% with normal wall motion. Low risk study.   Neuro/Psych CVA negative psych ROS   GI/Hepatic negative GI ROS, Neg liver ROS,   Endo/Other  negative endocrine ROS  Renal/GU negative Renal ROS     Musculoskeletal   Abdominal   Peds  Hematology   Anesthesia Other Findings   Reproductive/Obstetrics                            Anesthesia Physical  Anesthesia Plan  ASA: IV  Anesthesia Plan: General   Post-op Pain Management:    Induction: Intravenous  PONV Risk Score and Plan: 4 or greater and Ondansetron and Dexamethasone  Airway Management Planned: Oral  ETT  Additional Equipment:   Intra-op Plan:   Post-operative Plan: Extubation in OR  Informed Consent: I have reviewed the patients History and Physical, chart, labs and discussed the procedure including the risks, benefits and alternatives for the proposed anesthesia with the patient or authorized representative who has indicated his/her understanding and acceptance.     Dental advisory given  Plan Discussed with: CRNA, Anesthesiologist and Surgeon  Anesthesia Plan Comments: (PAT note written by Myra Gianotti, PA-C. )       Anesthesia Quick Evaluation

## 2019-02-23 NOTE — Progress Notes (Signed)
Patient ID: Kristina Mayer, female   DOB: [birth year], 58 y.o.   MRN: [id number] S: Kristina Mayer condition continued to deteriorate last night and was unable to tolerate CVVHD on 2 separate attempts due to hypotension.  O:BP (!) 56/31   Pulse (!) 125   Temp 97.9 F (36.6 C)   Resp (!) 32   Ht 5' 2"  (1.575 m)   SpO2 96%   BMI (P) 16.57 kg/m   Intake/Output Summary (Last 24 hours) at 03/01/2019 0938 Last data filed at 03/13/2019 0900 Gross per 24 hour  Intake 5957.94 ml  Output 0 ml  Net 5957.94 ml   Intake/Output: I/O last 3 completed shifts: In: 10304.8 [I.V.:6664.3; Blood:945; Other:10; IV Piggyback:2685.5] Out: 200 [Urine:200]  Intake/Output this shift:  Total I/O In: 1592.3 [I.V.:1558; IV Piggyback:34.3] Out: 0  Weight change:  Gen: intubated and awake CVS: tachy Resp: cta Abd: hypoactive, tender on palpation (grimaces) Ext: cyanosis of toes, no edema  Recent Labs  Lab 02/13/2019 1153  02/26/2019 1324  02/20/2019 1622 03/05/2019 1630  02/22/19 0330 02/22/19 0455 02/22/19 1113 02/22/19 1800 02/22/19 2126 03/02/2019 0005 02/12/2019 0102 02/18/2019 0316 02/14/2019 0449  NA 142   < > 147*   < >  --  148*   < > 145 146* 142  --  144  --  147* 147* 147*  K 4.8   < > 3.5   < >  --  3.4*   < > 5.7* 5.2* 6.1* 5.9* 6.0* 5.5* 5.5* 6.1* 5.1  CL  --   --   --   --   --  107  --  110  --  114*  --   --   --   --  111  --   CO2  --   --   --   --   --  25  --  20*  --  18*  --   --   --   --  17*  --   GLUCOSE 675*  --  227*  --   --  214*  --  163*  --  183*  --   --   --   --  50*  --   BUN  --   --   --   --   --  6  --  11  --  14  --   --   --   --  16  --   CREATININE  --   --   --   --  1.32* 1.43*  --  2.25*  --  2.60*  --   --   --   --  3.30*  --   ALBUMIN  --   --   --   --   --  2.5*  --  2.0*  --  1.9*  --   --   --   --  1.9*  --   CALCIUM  --   --   --   --   --  9.1  --  7.5*  --  7.1*  --   --   --   --  6.6*  --   PHOS  --   --   --   --   --  5.2*  --   --   --   --   --    --   --   --  6.3*  --   AST  --   --   --   --   --   --   --  554*  --  1,235*  --   --   --   --  4,508*  --   ALT  --   --   --   --   --   --   --  242*  --  326*  --   --   --   --  1,018*  --    < > = values in this interval not displayed.   Liver Function Tests: Recent Labs  Lab 02/22/19 0330 02/22/19 1113 03/14/2019 0316  AST 554* 1,235* 4,508*  ALT 242* 326* 1,018*  ALKPHOS 39 51 101  BILITOT 0.5 0.5 1.0  PROT 3.1* 3.0* 3.2*  ALBUMIN 2.0* 1.9* 1.9*   Recent Labs  Lab 02/22/19 0330  AMYLASE 121*   No results for input(s): AMMONIA in the last 168 hours. CBC: Recent Labs  Lab 03/06/2019 2229  02/22/19 0330  02/22/19 1113 02/22/19 1920  02/15/2019 0102 02/22/2019 0316 03/01/2019 0449  WBC 14.2*  --  16.3*  --  22.9* 25.5*  --   --  27.2*  --   HGB 7.9*   < > 8.4*   < > 8.0* 5.9*   < > 9.2* 12.9 10.2*  HCT 22.8*   < > 24.1*   < > 23.4* 18.0*   < > 27.0* 37.6 30.0*  MCV 92.3  --  92.0  --  94.0 100.0  --   --  90.6  --   PLT 122*  --  125*  --  113* 88*  --   --  75*  --    < > = values in this interval not displayed.   Cardiac Enzymes: No results for input(s): CKTOTAL, CKMB, CKMBINDEX, TROPONINI in the last 168 hours. CBG: Recent Labs  Lab 02/19/2019 0014 02/22/2019 0100 02/15/2019 0326 03/02/2019 0358 02/17/2019 0745  GLUCAP 140* 85 42* 146* 74    Iron Studies: No results for input(s): IRON, TIBC, TRANSFERRIN, FERRITIN in the last 72 hours. Studies/Results: Dg Chest Port 1 View  Result Date: 03/11/2019 CLINICAL DATA:  Endotracheal tube, respiratory failure. EXAM: PORTABLE CHEST 1 VIEW COMPARISON:  02/22/2019. FINDINGS: Endotracheal tube terminates approximately 5.5 cm above the carina. Esophageal temperature probe is in place. Nasogastric tube is followed into the stomach with the tip projecting beyond the inferior margin of the image. Left IJ dialysis catheter terminates in the right atrium. Right IJ central line tip terminates near the SVC RA junction. Heart size normal.  Thoracic aorta is calcified. Left lower lobe collapse/consolidation persists. Small bilateral pleural effusions, layering on the right, similar. Biapical pleural thickening. No pneumothorax. Old left rib fractures. IMPRESSION: 1. Left lower lobe collapse/consolidation, pneumonia cannot be excluded. 2. Small bilateral pleural effusions, stable. 3.  Aortic atherosclerosis (ICD10-170.0). Electronically Signed   By: Lorin Picket M.D.   On: 03/04/2019 08:20   Dg Chest Port 1 View  Result Date: 02/22/2019 CLINICAL DATA:  Status post dialysis catheter placement EXAM: PORTABLE CHEST 1 VIEW COMPARISON:  03/03/2019 FINDINGS: Endotracheal tube, gastric catheter and right jugular central line are again noted and stable. New left jugular temporary dialysis catheter is noted with the tip in the superior aspect of the right atrium. No pneumothorax is seen. Hazy density over the bases bilaterally likely related to small effusions. IMPRESSION: No pneumothorax following central line placement. Small effusions posteriorly. Electronically Signed   By: Inez Catalina M.D.   On: 02/22/2019 16:39   Dg Chest Port 1 View  Result Date: 02/17/2019 CLINICAL DATA:  Postop EXAM: PORTABLE CHEST 1 VIEW COMPARISON:  February 14, 2018 FINDINGS: There is a right-sided central venous catheter with the tip at the cavoatrial junction. ET tube is seen 1.5 cm above the level of the carina. NG tube is seen coursing below the diaphragm. There is hazy airspace opacity seen within the right lung base. There is a trace right pleural effusion. Again noted is hyperinflation of the lung zones with biapical scarring. Increased interstitial markings seen throughout both lungs. The cardiomediastinal silhouette is unchanged. IMPRESSION: Central venous catheter with the tip at the superior cavoatrial junction. ET tube and enteric tube in satisfactory position Hazy airspace opacity at the right lower lung which could be layering effusion/atelectasis. Trace right  pleural effusion Findings of COPD Electronically Signed   By: Prudencio Pair M.D.   On: 03/11/2019 18:16   . sodium chloride   Intravenous Once  . chlorhexidine gluconate (MEDLINE KIT)  15 mL Mouth Rinse BID  . Chlorhexidine Gluconate Cloth  6 each Topical Daily  . Chlorhexidine Gluconate Cloth  6 each Topical Daily  . docusate sodium  100 mg Oral Daily  . heparin injection (subcutaneous)  5,000 Units Subcutaneous Q8H  . insulin aspart  0-15 Units Subcutaneous Q4H  . mouth rinse  15 mL Mouth Rinse 10 times per day  . nicotine  14 mg Transdermal Daily  . pantoprazole (PROTONIX) IV  40 mg Intravenous Q24H  . sodium chloride flush  10-40 mL Intracatheter Q12H  . sodium polystyrene  30 g Per Tube Once    BMET    Component Value Date/Time   NA 147 (H) 03/08/2019 0449   K 5.1 03/10/2019 0449   CL 111 03/14/2019 0316   CO2 17 (L) 03/03/2019 0316   GLUCOSE 50 (L) 03/08/2019 0316   GLUCOSE 109 12/09/2013 1027   BUN 16 02/22/2019 0316   CREATININE 3.30 (H) 02/19/2019 0316   CALCIUM 6.6 (L) 02/12/2019 0316   GFRNONAA 15 (L) 03/05/2019 0316   GFRAA 17 (L) 03/01/2019 0316   CBC    Component Value Date/Time   WBC 27.2 (H) 03/04/2019 0316   RBC 4.15 02/25/2019 0316   HGB 10.2 (L) 03/10/2019 0449   HCT 30.0 (L) 02/22/2019 0449   PLT 75 (L) 02/17/2019 0316   MCV 90.6 03/13/2019 0316   MCH 31.1 03/12/2019 0316   MCHC 34.3 02/15/2019 0316   RDW 15.1 03/08/2019 0316   LYMPHSABS 4.1 (H) 04/16/2018 0737   MONOABS 1.0 04/16/2018 0737   EOSABS 0.1 04/16/2018 0737   BASOSABS 0.0 04/16/2018 0737    Assessment/Plan: 1.  AKI- due to ischemic ATN in setting of prolonged supra-renal aorta cross clamp, ABLA, and hypotension.  She developed worsening acidosis, hyperkalemia, and remains anuric.   1. Attempted CVVHD x 2 yesterday but had significant drops in BP and tachycardia.  She likely has mesenteric ischemia and possible bowel necrosis and is to go for exp lap today and may try another attempt  after if she tolerates surgery. 2. VDRF- per PCCM 3. ABLA- Hgb up to 10.2 today.  Continue to follow and transfuse prn 4. Thrombocytopenia- likely functional consumption.  Continue to follow and transfuse prn 5. Abnormal LFT's- likely due to shock liver.  Continue to follow levels and stop statin. 6. Shock- on 3 pressors, concerning for ischemic bowel as Hgb has improved.  7. Disposition- poor overall prognosis especially if she cannot tolerate CVVHD.  Will await results of exp lap.  Donetta Potts, MD [employer] [phone]

## 2019-02-23 NOTE — Progress Notes (Signed)
Dr. Lynetta Mare aware of ABG and chem8 results. Also aware of critical lactic acid >11 and critical INR 9.7. Orders received.  Joellen Jersey, RN

## 2019-02-23 NOTE — Progress Notes (Signed)
CRITICAL VALUE ALERT  Critical Value:  9.4 lactic acid   Date & Time Notied:  03/10/2019 0030  Provider Notified: Dr. Oletta Darter  Orders Received/Actions taken: Continue to trend. Check hemoglobin post 2 RBC unit transfusion. Tentative plan to draw co-ox in the morning.

## 2019-02-23 NOTE — Progress Notes (Signed)
Updated family this evening.  Again discussed very grave prognosis.  She remains very acidotic with a rising lactic acid.  After return to OR today no overt source for ongoing instability.  I suspect given her underlying deconditioning she has not been able to overcome the stress of surgery with her own physiology.  Worsening coagulopathy that is being aggressively resuscitated in the ICU.  Remains anuric on CRRT.  Liver enzymes are rising.  Multisystem organ failure.  Ongoing pressor requirements.  Patient has been made DNR.  Marty Heck, MD [facility] Office: [phone] Pager: [place]

## 2019-02-23 NOTE — Progress Notes (Signed)
Patient with a large maroon colored BM. Abdominal incision oozing; dressing marked. Dr. Lynetta Mare made aware. Currently receiving vitamin K and 2 units FFP. Will await new orders.  Joellen Jersey, RN

## 2019-02-23 NOTE — Progress Notes (Signed)
[facility]  Subjective  - Intubated.  Anuric.  Did not tolerate CRRT - hypotensive with systolics in the 42'V.  LA now 9.  On 3 pressors.   Objective (!) 87/66 (!) 117 98.2 F (36.8 C) (!) 32 93%  Intake/Output Summary (Last 24 hours) at 03/01/2019 0805 Last data filed at 03/03/2019 0600 Gross per 24 hour  Intake 4365.69 ml  Output 0 ml  Net 4365.69 ml    Abdominal and bilateral groin incisions c/d/i Brisk DP/Pt signals BLE Feet warm  Laboratory Lab Results: Recent Labs    02/22/19 1920  02/19/2019 0316 03/02/2019 0449  WBC 25.5*  --  27.2*  --   HGB 5.9*   < > 12.9 10.2*  HCT 18.0*   < > 37.6 30.0*  PLT 88*  --  75*  --    < > = values in this interval not displayed.   BMET Recent Labs    02/22/19 1113  03/04/2019 0316 02/17/2019 0449  NA 142   < > 147* 147*  K 6.1*   < > 6.1* 5.1  CL 114*  --  111  --   CO2 18*  --  17*  --   GLUCOSE 183*  --  50*  --   BUN 14  --  16  --   CREATININE 2.60*  --  3.30*  --   CALCIUM 7.1*  --  6.6*  --    < > = values in this interval not displayed.    COAG Lab Results  Component Value Date   INR 1.6 (H) 03/03/2019   INR  02/25/2019    SPECIMEN HEMOLYZED. HEMOLYSIS MAY AFFECT INTEGRITY OF RESULTS.   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  Unfortunately was anuric yesterday and attempted CRRT but did not tolerate and became profoundly  hypotensive on two different attempts with systolics in the 95G.  Now on 3 pressors this morning on vasopressin, Levophed and neo-Synephrine.  Lactic acid is increased to 9.  Shock liver as well.  Discussed with daughter at bedside that very grave prognosis and now in multisystem organ failure.  I will plan to take her back for exploratory laparotomy to ensure does not have any ischemic intestine the potential could be the source for ongoing instability.  Marty Heck 02/15/2019 8:05 AM --

## 2019-02-23 NOTE — Progress Notes (Signed)
Patient requiring much more pressor support. On levo, neo, and vaso. CCM notified. Received orders for 1L NS bolus.   Joellen Jersey, RN

## 2019-02-23 NOTE — Op Note (Signed)
Date: February 23, 2019   Preoperative diagnosis: Hypovolemic shock status post aortic endarterectomy and aortobifemoral bypass  Postoperative diagnosis: Same  Procedure: Exploratory laparotomy  Surgeon: Dr. Marty Heck, MD  Assistant: Roxy Horseman, [place]  Indications: Patient is a 58 year old female that underwent aortic endarterectomy and aortobifemoral bypass on Monday for occlusive disease.  Ultimately she had to have prolonged supra-renal and supraceliac clamp.  She initially did well in the ICU and then postop day 1 became uric with worsening acidosis.  We have attempted maximal conservative management as well as CRRT with ongoing acidosis and hemodynamic instability.  She presents today to operating room for exploratory laparotomy to rule out any underlying ischemic bowel or other obvious defect.  Findings: There was a pulse in the SMA.  The small bowel all appeared viable although slightly underperfused likely from multiple pressors.  Both the left, right, transverse colon, as well as the sigmoid colon were all viable.  There was no evidence of frank bleeding from the bypass graft.  There was large volume of sanguinous ascites from massive resuscitation in the ICU.  There was oozing in the retroperitoneum but no frank bleeding source.  She had excellent pulse in both limbs of the graft.  There was some bile staining around the gallbladder and I did have Dr. Donne Hazel take a look at that and he thought it was not driving her physiology.  Her stomach also appeared viable.  Anesthesia: General  EBL: 500 mL's and Cell Saver patient got 150 mL returned  Details: Patient was taken to the operating room after informed consent was obtained from the daughter.  She was placed on operative table in supine position after being transported from the ICU while intubated on multiple pressors.  Her abdominal wall was then prepped and draped usual sterile fashion.  A preop timeout was performed  identify patient procedure and site.  Initially opened her previous midline incision with Metzenbaum scissors as well as opening the deep PDS layer closing the fascia.  Upon entering the peritoneal cavity there was a large volume of bloody ascites from her resuscitation.  All this was collected in Cell Saver approximately 500 mL's.  Upon closer inspection there was no evidence of retroperitoneal hematoma.  The graft was easily palpable just below the retroperitoneal layer that had been closed.  There was a pulse in both limbs of the graft.  There was a pulse in the SMA that was palpable.  The small bowel appeared underperfused but not frankly ischemic or necrotic.  The left and right colon as well as the transverse colon and sigmoid colon also all appeared viable with no frank ischemia.  The liver also appeared viable.  There was bile staining around the gallbladder even though this was soft.  I did have Dr. Donne Hazel come and take a look at the gallbladder and he did not feel that this was the etiology for her ongoing resuscitation requirement.  At that point in time there was no obvious source of ischemic gut and/or active bleeding that would explain her ongoing resuscitation requirements.  I presume this is related to her physiology of large aortic surgery with supraceliac clamp.  I did put surgicel in the retroperitoneum on the right where it was oozy and closed additional tissue to cover the right graft limb with running 3-0 Vicryl.  Surgicel snow was also placed around supra-celiac clamp site.  That point time we decided to close her abdominal cavity.  Midline fascia was run closed  with #1 PDS double-stranded.  I ended up stapling the midline skin incision.  Incisional vacs were applied to both groins since she has serous fluid leaking from both incisions.  She will be taken back to ICU in critical condition.  Condition: Critical  Complication: None  Marty Heck, MD [facility] [place] Office: [phone] Pager: [place]

## 2019-02-23 NOTE — Progress Notes (Addendum)
Patient received 2g calcium chloride push and 151mEq push sodium bicarb prior to starting CRRT. CCM at bedside.  Joellen Jersey, RN

## 2019-02-23 NOTE — Progress Notes (Signed)
NAME:  Kristina Mayer, MRN:  [id number], DOB:  Jan 26, 1961, LOS: 2 ADMISSION DATE:  03/09/2019, CONSULTATION DATE:  02/20/2019 REFERRING MD:  Dr. Carlis Abbott, Vascular Surgery, CHIEF COMPLAINT:  CLI of BLE and aortoiliac disease, S/p aortobifemoral bypass and aortic endarterectomy   Brief History   58F with PHMx of HTN, hyperlipidemia, CVA (2012), hx of PDA repair, CLI of BLE, aortoiliac occlusive disease, who is now s/p aortobifemoral bypass and aortic endarterectomy of infra-renal and supra-renal aorta (03/13/2019). Surgery complicated by EBL of 9798. Patient was resuscitated with 6u pRBCs, 4u FFP, 2u platelets, 2u cryoprecipitate, and 1100 mL Cell Saver. Transferred to ICU post-operatively, in stable condition, still intubated.  History of present illness   Kristina Mayer is a 58 year old woman with a PHMx significant for HTN, hyperlipidemia, CVA (2012), hx of PDA repair, tobacco use disorder, CLI of BLE, aortoiliac occlusive disease, who is now s/p aortobifemoral bypass and aortic endarterectomy of infra-renal and supra-renal aorta (03/09/2019). Surgery complicated by EBL of 9211. Patient was resuscitated with 6u pRBCs, 4u FFP, 2u platelets, 2u cryoprecipitate, and 1100 mL Cell Saver. Transferred to ICU post-operatively, in stable condition, still intubated.  Past Medical History  Hypertension Hyperlipidemia Chronic Limb Ischemia Aortoiliac occlusive disease Tobacco use disorder History of PDA repair  Significant Hospital Events   8/10: Aortobifemoral bypass and aortic endarterectomy    Consults:  Vascular Surgery PCCM Nephrology  Procedures:  8/10: Aortobifemoral bypass and aortic endarterectomy  ETT 8/10 >>  Left IJ HD catheter 8/11 >>  Significant Diagnostic Tests:  CT Angiography (02/02/19): Advanced aortic atherosclerosis, with bulky calcified and soft plaque of the juxtarenal aorta/infrarenal aorta nearly filling the aortic lumen, and contributing to occlusion just above the IMA takeoff.  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 Bilateral left greater than right renal arterial disease with high-grade stenosis of the main left renal artery and associated renal cortical thinning. Bilateral iliac arterial disease with at least 50% narrowing at 2 locations of the right common iliac artery, and more mild left common iliac arterial disease.  Echo (02/07/19): EF 55-60% with some diastolic dysfunction noted  Lexiscan (02/07/19): Myocardial perfusion imaging normal, low risk study.  Echo (02/22/19): Normal LV systolic function with EF 55-60%. Cavity size normal. Left ventricular diastolic parameters normal. RV normal systolic function. Cavity normal. No increase in right ventricular wall thickness. Trivial pericardial effusion lateral to RV although likely mostly prominent epicardial fat. Mild thickening of the mitral valve leaflet. Mild calcification of the mitral valve leaflet. Aortic valve is tricuspid. Mild thickening of the aortic valve. Mild calcification of the aortic valve.  Micro Data:  SARS CoV 2 (02/17/19): Negative MRSA PCR (02/15/19): Negative  Antimicrobials:  Cefazolin (8/10, 8/12) for surgical prophylaxis Ceftriaxone (8/12- ) Metronidazole (8/12- )  Interim history/subjective:  Overnight, patient did not tolerate CRRT x 2 due to significant hypotension. She is now on 3 pressors (NE, phenylephrine, and vasopressin). Received 2u pRBC, hemoglobin responded to transfusion but blood pressure did not. Patient remains anuric and hyperkalemic. Lactate is up-trending (9.3) and patient is acidotic. She weaned off propofol, remains intubated on PRVC. Patient also became tachycardic overnight and is in sinus tachycardia this morning.  Vascular Surgery to take to OR for exploratory laparotomy today. Patient with increasing pressor requirement prior to OR,  received an additional 1L NS bolus.   Objective   Blood pressure 108/73, pulse (!) 122, temperature (!) 97.5  F (36.4 C), resp. rate (!) 32, height 5\' 2"  (1.575 m), weight 41.1 kg, SpO2 94 %. CVP:  [11 mmHg-15 mmHg] 11 mmHg  Vent Mode: PRVC FiO2 (%):  [40 %-50 %] 50 % Set Rate:  [12 bmp-32 bmp] 32 bmp Vt Set:  [400 mL] 400 mL PEEP:  [5 cmH20-8 cmH20] 8 cmH20 Plateau Pressure:  [18 cmH20-25 cmH20] 25 cmH20   Intake/Output Summary (Last 24 hours) at 02/17/2019 1236 Last data filed at 03/05/2019 1213 Gross per 24 hour  Intake 6441.58 ml  Output 0 ml  Net 6441.58 ml   Filed Weights   03/02/2019 1050  Weight: 41.1 kg    Examination: General: Intubated, sedated HENT: ETT in place. NG tube in place. Pupils equal, reactive. Neck: Supple. JVD difficult to assess.  Lungs: Clear to anterior auscultation bilaterally. Equal breath sounds bilaterally.  Cardiovascular: Normal rate, normal S1, S2, no murmurs appreciated. Unable to palpate dorsalis pedis pulse, but able to obtain Doppler signal bilaterally. Abdomen: Soft, non-distended. Positive bowel sounds. Midline abdominal incision and bilateral groin incisions are c/d/i, with ecchymoses noted along incisions. Extremities: No lower extremity edema. Bilateral lower extremities are cool with shiny skin, loss of hair, and muscle atrophy consistent with arterial insufficiency. Skin: Anasarca noted Neuro: Sedated, opens eyes, is redirectable. GU: Foley catheter in place.  Resolved Hospital Problem list     Assessment & Plan:  Kristina Mayer is a 58 year old woman with a PHMx significant for HTN, hyperlipidemia, CVA (2012), hx of PDA repair, tobacco use disorder, CLI of BLE, aortoiliac occlusive disease, who is now s/p aortobifemoral bypass and aortic endarterectomy of infra-renal and supra-renal aorta (03/02/2019). Surgery complicated by significant bleeding and prolonged supra-renal and supra-celiac clamping. Patient was resuscitated with 6u pRBCs, 4u  FFP, 2u platelets, 2u cryoprecipitate, and 1100 mL Cell Saver prior to transfer to ICU. Transferred in stable condition, intubated.   Acute respiratory failure requiring mechanical ventilation following complex vascular surgery Patient remains intubated s/p aortobifemoral bypass and aortic endarterectomy of infra-renal and supra-renal aorta. - Continue ventilatory support. - Continue fentanyl gtt (propofol has been weaned). - VAP precautions.  Acute Kidney Injury, worsening hyperkalemia and acidemia Suspect ATN secondary to decreased tissue perfusion given supra-renal clamp during surgery. Patient remains anuric with up-trending Cr to 3.30. CRRT attempted twice yesterday (8/11) but patient did not tolerate due to significant hypotension and tachycardia. She has received significant fluid resuscitation including an additional 2u pRBC overnight, but remains hypotensive requiring 3 pressors. Appreciate Nephrology assistance. - Exploratory laparotomy per Vascular Surgery. - Plan to initiate CRRT after laparotomy if able. - Continue maintenance IVFs. - Follow BMP and urine output. - Continue norepinephrine, phenylephrine, and vasopressin to maintain adequate tissue perfusion.  S/p aortic endarterectomy with aortobifemoral bypass (02/27/2019), c/b acute blood loss, persistently elevated lactic acid, shock, c/f intestinal ischemia Patient is now on three pressors for persistent hypotension. She is also tachycardic. Lactic acid has increased to 9.3, concerning for intestinal ischemia. Distal lower extremity pulses able to be obtained with Doppler.  - Exploratory laparotomy per Vascular Surgery. - Continue norepinephrine, phenylephrine, vasopressin to maintain tissue perfusion. - Start stress dose steroids with hydrocortisone 50mg  q6 hours. - Start ceftriaxone (8/12- ). - Start metronidazole (8/12- ). - CRRT - Neurovascular checks every 4 hours. - Monitor for signs of reperfusion injury. - Trend  lactic acid  Anemia Patient with acute blood loss during surgery, resuscitation as above. Hemoglobin 12.9 s/p 2u pRBCs overnight.  - Exploratory laparotomy as above. - Continue to follow  and transfuse as needed. - Monitor for signs/symptoms of bleeding.  Transaminitis AST/ALT trending up to 4508/1018, concerning for ischemic hepatitis given prolonged supra-celiac clamp time. - Continue maintenance IVFs. - Follow CMP.   Thrombocytopenia Platelets trending down to 75. Suspect functional consumption given acute illness, lower concern for HIT. - Continue to follow and transfuse as needed.  Hypomagnesemia -Replete magnesium.  Tobacco Use Disorder, likely COPD Patient has 40 pack-year smoking history. CXR suggestive of COPD, but no formal diagnosis. - Referral for outpatient PFTs. - Smoking cessation support and counseling.  Elevated Blood Glucose Patient with elevated blood glucose, but no known history of diabetes. Repeat Hgb A1c 5.3 - Decrease Novolog correction to sensitive q 4 hours, given instances of hypoglycemia post-correction.  Best practice:  Diet: NPO Pain/Anxiety/Delirium protocol (if indicated): fentanyl gtt VAP protocol (if indicated): Yes DVT prophylaxis: heparin GI prophylaxis: pantoprazole Glucose control: SSI sensitive Mobility: Bedrest Code Status: Full code Family Communication: Will update patient's daughter Disposition: OR for exploratory laparotomy, then return to ICU  Labs   CBC: Recent Labs  Lab 03/05/2019 2229  02/22/19 0330  02/22/19 1113 02/22/19 1920 02/22/19 2126 02/16/2019 0102 03/06/2019 0316 03/11/2019 0449  WBC 14.2*  --  16.3*  --  22.9* 25.5*  --   --  27.2*  --   HGB 7.9*   < > 8.4*   < > 8.0* 5.9* 5.8* 9.2* 12.9 10.2*  HCT 22.8*   < > 24.1*   < > 23.4* 18.0* 17.0* 27.0* 37.6 30.0*  MCV 92.3  --  92.0  --  94.0 100.0  --   --  90.6  --   PLT 122*  --  125*  --  113* 88*  --   --  75*  --    < > = values in this interval not  displayed.    Basic Metabolic Panel: Recent Labs  Lab 03/10/2019 1324  02/22/2019 1622 02/27/2019 1630  02/22/19 0330  02/22/19 1113  02/22/19 2126 02/22/2019 0005 03/04/2019 0102 03/10/2019 0316 02/23/19 0449  NA 147*   < >  --  148*   < > 145   < > 142  --  144  --  147* 147* 147*  K 3.5   < >  --  3.4*   < > 5.7*   < > 6.1*   < > 6.0* 5.5* 5.5* 6.1* 5.1  CL  --   --   --  107  --  110  --  114*  --   --   --   --  111  --   CO2  --   --   --  25  --  20*  --  18*  --   --   --   --  17*  --   GLUCOSE 227*  --   --  214*  --  163*  --  183*  --   --   --   --  50*  --   BUN  --   --   --  6  --  11  --  14  --   --   --   --  16  --   CREATININE  --   --  1.32* 1.43*  --  2.25*  --  2.60*  --   --   --   --  3.30*  --   CALCIUM  --   --   --  9.1  --  7.5*  --  7.1*  --   --   --   --  6.6*  --   MG  --   --   --   --   --  1.3*  --  1.6*  --   --   --   --  1.5*  --   PHOS  --   --   --  5.2*  --   --   --   --   --   --   --   --  6.3*  --    < > = values in this interval not displayed.   GFR: Estimated Creatinine Clearance: 12.1 mL/min (A) (by C-G formula based on SCr of 3.3 mg/dL (H)). Recent Labs  Lab 02/22/19 0330 02/22/19 1113 02/22/19 1125 02/22/19 1920 02/22/19 2343 02/17/2019 0316 03/10/2019 0350  WBC 16.3* 22.9*  --  25.5*  --  27.2*  --   LATICACIDVEN 6.2*  --  6.4* 7.0* 9.4*  --  9.3*    Liver Function Tests: Recent Labs  Lab 03/11/2019 1630 02/22/19 0330 02/22/19 1113 02/17/2019 0316  AST  --  554* 1,235* 4,508*  ALT  --  242* 326* 1,018*  ALKPHOS  --  39 51 101  BILITOT  --  0.5 0.5 1.0  PROT  --  3.1* 3.0* 3.2*  ALBUMIN 2.5* 2.0* 1.9* 1.9*   Recent Labs  Lab 02/22/19 0330  AMYLASE 121*   No results for input(s): AMMONIA in the last 168 hours.  ABG    Component Value Date/Time   PHART 7.311 (L) 02/14/2019 0449   PCO2ART 32.1 03/06/2019 0449   PO2ART 56.0 (L) 03/10/2019 0449   HCO3 16.2 (L) 02/22/2019 0449   TCO2 17 (L) 03/01/2019 0449   ACIDBASEDEF  9.0 (H) 03/07/2019 0449   O2SAT 61.7 02/23/2019 0550     Coagulation Profile: Recent Labs  Lab 02/23/2019 1630 03/10/2019 1745  INR SPECIMEN HEMOLYZED. HEMOLYSIS MAY AFFECT INTEGRITY OF RESULTS. 1.6*    Cardiac Enzymes: No results for input(s): CKTOTAL, CKMB, CKMBINDEX, TROPONINI in the last 168 hours.  HbA1C: Hgb A1c MFr Bld  Date/Time Value Ref Range Status  03/02/2019 05:30 PM 5.3 4.8 - 5.6 % Final    Comment:    REPEATED TO VERIFY (NOTE) Pre diabetes:          5.7%-6.4% Diabetes:              >6.4% Glycemic control for   <7.0% adults with diabetes   12/09/2013 10:27 AM 5.5 <5.7 % Final    Comment:                                                                          According to the ADA Clinical Practice Recommendations for 2011, whenHbA1c is used as a screening test:   >=6.5%   Diagnostic of Diabetes Mellitus           (if abnormal result is  confirmed) 5.7-6.4%   Increased risk of developing Diabetes Mellitus References:Diagnosis and Classification of Diabetes Mellitus,DiabetesCare,2011,34(Suppl 1):S62-S69 and Standards of Medical Care in        Diabetes - 2011,Diabetes Care,2011,34 (Suppl  1):S11-S61.     CBG: Recent Labs  Lab 02/16/2019 0014 03/02/2019 0100 02/13/2019 0326 03/12/2019 0358 03/09/2019 0745  GLUCAP 140* 85 42* 146* 48    Past Medical History  She,  has a past medical history of Allergic rhinitis, Allergy, Aortoiliac occlusive disease ([place]), Blood transfusion, Cervical cancer (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   Surgical History    Past Surgical History:  Procedure Laterality Date  . ABDOMINAL HYSTERECTOMY    . AORTA - BILATERAL FEMORAL ARTERY BYPASS GRAFT N/A 03/08/2019   Procedure: AORTOBIFEMORAL BYPASS GRAFT Using Hemashield Graft;  Surgeon: Marty Heck, MD;  Location: [place];  Service: Vascular;  Laterality: N/A;  . AORTIC  ENDARTERECETOMY N/A 02/23/2019   Procedure: Aortic Endarterecetomy;  Surgeon: Marty Heck, MD;  Location: [facility];  Service: Vascular;  Laterality: N/A;  . CARDIAC VALVE SURGERY    . COLONOSCOPY    . EYE SURGERY       Social History   reports that she has been smoking cigarettes. She has a 20.00 pack-year smoking history. She has never used smokeless tobacco. She reports that she does not drink alcohol or use drugs.   Family History   Her family history includes Cancer in her mother; Colon cancer in her paternal aunt; Esophageal cancer in her maternal grandfather; Heart attack in her brother; Heart disease in her brother; Hypertension in her mother and sister; Stroke in her maternal uncle and mother. There is no history of Rectal cancer or Stomach cancer.   Allergies No Known Allergies   Home Medications  Prior to Admission medications   Medication Sig Start Date End Date Taking? Authorizing Provider  aspirin EC 325 MG tablet Take 325 mg by mouth daily.   Yes [provider]  atorvastatin (LIPITOR) 40 MG tablet Take 1 tablet (40 mg total) by mouth daily. 02/03/19 05/04/19 Yes Miquel Dunn, NP  butalbital-acetaminophen-caffeine (FIORICET) [phone] MG per tablet Take 1 tablet by mouth 2 (two) times daily as needed for headache. 02/21/14  Yes Advani, Vernon Prey, MD  clopidogrel (PLAVIX) 75 MG tablet Take 75 mg by mouth daily.    Yes [provider]  DULoxetine (CYMBALTA) 30 MG capsule Take 30 mg by mouth 2 (two) times daily.   Yes [provider]  lisinopril (PRINIVIL,ZESTRIL) 20 MG tablet Take 20 mg by mouth daily.   Yes [provider]  loratadine (CLARITIN) 10 MG tablet Take 1 tablet (10 mg total) by mouth daily. 01/23/14  Yes Advani, Vernon Prey, MD  metoprolol succinate (TOPROL XL) 25 MG 24 hr tablet Take 1 tablet (25 mg total) by mouth daily. 02/03/19  Yes Miquel Dunn, NP  potassium chloride (MICRO-K) 10 MEQ CR capsule Take 10 mEq by  mouth daily.   Yes [provider]  OVER THE COUNTER MEDICATION Apply 1 application topically daily. Theraworx topical cream    [provider]      Atilano Ina, MS4

## 2019-02-23 NOTE — Progress Notes (Signed)
eLink Physician-Brief Progress Note Patient Name: Kristina Mayer DOB: 05-08-1961 MRN: [id number]   Date of Service  03/12/2019  HPI/Events of Note  Multiple issues: 1. ABG on 40%/PRVC 24/TV 400/P 8 = 7.27/41.9/60 and Hypoglycemia - Blood glucose = 40. Na+ = 147.   eICU Interventions  Will order: 1. Increase PRVC rate to 32.  2. Repeat ABG at 5 AM. 3. D5 0.45 Nacl to run IV at 50 mL/hour.      Intervention Category Major Interventions: Other:;Acid-Base disturbance - evaluation and management;Respiratory failure - evaluation and management  Ra Pfiester Eugene 03/11/2019, 1:20 AM

## 2019-02-23 NOTE — Progress Notes (Signed)
PT Cancellation Note  Patient Details Name: Kristina Mayer MRN: [id number] DOB: Apr 13, 1961   Cancelled Treatment:    Reason Eval/Treat Not Completed: Patient not medically ready;Medical issues which prohibited therapy;Other (comment)(Sign off per nurse.  Please reorder as appropriate!)   Denice Paradise 02/23/2019, 11:35 AM  [facility]ne Acute Rehabilitation Services Pager:  [phone]  Office:  [phone]

## 2019-02-23 NOTE — Progress Notes (Signed)
eLink Physician-Brief Progress Note Patient Name: Kristina Mayer DOB: 1960-11-01 MRN: [id number]   Date of Service  03/05/2019  HPI/Events of Note  Multiple issues: 1. Hyperkalemia - K+ = 5.1, 2. Second episode of hypoglycemia with blood glucose = 42 and 3. Lactic Acid = 9.3. CVP = 15 and Hgb = 12.9 (???). BP = 110/96.  eICU Interventions  Will order: 1. Kayexalate 30 gm per tube now.  2. Increase D5 0.45 NaCl to 100 mL/hour.  3. Repeat H/H now. 4. COOX now.      Intervention Category Major Interventions: Electrolyte abnormality - evaluation and management;Other:  Sommer,Steven Cornelia Copa 02/23/2019, 5:25 AM

## 2019-02-23 NOTE — Anesthesia Postprocedure Evaluation (Signed)
Anesthesia Post Note  Patient: Kristina Mayer  Procedure(s) Performed: Exploratory Laparotomy (N/A Abdomen) Application Of Wound Vac (N/A Abdomen)     Patient location during evaluation: PACU Anesthesia Type: General Level of consciousness: sedated Pain management: pain level controlled Vital Signs Assessment: post-procedure vital signs reviewed and stable Respiratory status: spontaneous breathing and respiratory function stable Cardiovascular status: stable Postop Assessment: no apparent nausea or vomiting Anesthetic complications: no    Last Vitals:  Vitals:   02/25/2019 1046 02/16/2019 1100  BP: 105/72 108/73  Pulse:  (!) 122  Resp: (!) 32 (!) 32  Temp: 36.6 C (!) 36.4 C  SpO2:  94%    Last Pain:  Vitals:   02/20/2019 0400  TempSrc: Esophageal  PainSc:                  Mung Rinker DANIEL

## 2019-02-23 NOTE — Progress Notes (Signed)
OT Cancellation Note  Patient Details Name: Kristina Mayer MRN: [id number] DOB: Aug 06, 1960   Cancelled Treatment:    Reason Eval/Treat Not Completed: Medical issues which prohibited therapy, pt not medically ready.  Per RN, sign off.  Please re-order as appropriate.   Delight Stare, OT Acute Rehabilitation Services Pager [phone] Office [phone]    Delight Stare 03/11/2019, 11:38 AM

## 2019-02-23 NOTE — Progress Notes (Signed)
Hypoglycemic Event  CBG: 41  Treatment: 1 amp of D50  Symptoms: none, pt vented, sedated  Follow-up CBG: Time: 0015 CBG Result: 140  Possible Reasons for Event: pt is NPO, no TF infusing, previous order of D5 1/2 NS discontinued earlier in the day of 8/11  Comments/MD notified: Dr. Oletta Darter, D5 1/2 NS at 50 continuous IV infusion ordered    Kristina Mayer

## 2019-02-23 NOTE — Transfer of Care (Signed)
Immediate Anesthesia Transfer of Care Note  Patient: Kristina Mayer  Procedure(s) Performed: Exploratory Laparotomy (N/A Abdomen) Application Of Wound Vac (N/A Abdomen)  Patient Location: ICU  Anesthesia Type:General  Level of Consciousness: Patient remains intubated per anesthesia plan  Airway & Oxygen Therapy: Patient remains intubated per anesthesia plan and Patient placed on Ventilator (see vital sign flow sheet for setting)  Post-op Assessment: Report given to RN and Post -op Vital signs reviewed and stable  Post vital signs: Reviewed and stable  Last Vitals:  Vitals Value Taken Time  BP 105/67 03/10/2019 1350  Temp    Pulse 104 02/16/2019 1347  Resp 11 02/22/2019 1352  SpO2 75 % 03/14/2019 1347  Vitals shown include unvalidated device data.  Last Pain:  Vitals:   02/23/19 0400  TempSrc: Esophageal  PainSc:       Patients Stated Pain Goal: 8 (21/30/86 5784)  Complications: No apparent anesthesia complications

## 2019-02-23 NOTE — Progress Notes (Signed)
Inpatient Diabetes Program Recommendations  AACE/ADA: New Consensus Statement on Inpatient Glycemic Control (2015)  Target Ranges:  Prepandial:   less than 140 mg/dL      Peak postprandial:   less than 180 mg/dL (1-2 hours)      Critically ill patients:  140 - 180 mg/dL   Lab Results  Component Value Date   GLUCAP 74 03/10/2019   HGBA1C 5.3 02/13/2019    Review of Glycemic Control  Diabetes history: No Current orders for Inpatient glycemic control: Novolog moderate correction q 4 hrs  Inpatient Diabetes Program Recommendations:   Noted hypoglycemia post correction given. -Decrease Novolog correction to sensitive q 4 hrs.  Thank you, Nani Gasser. Jordain Radin, RN, MSN, CDE  Diabetes Coordinator Inpatient Glycemic Control Team Team Pager [phone] (8am-5pm) 03/13/2019 10:07 AM

## 2019-02-23 NOTE — Progress Notes (Signed)
Pt received from OR staff. Currently being bagged by anesthesia. Pt placed back on ventilator, good return volumes and bilateral breath sounds. ETT remains at 22cm at the lip. RT will continue to monitor.

## 2019-02-24 ENCOUNTER — Encounter (HOSPITAL_COMMUNITY): Payer: Self-pay | Admitting: Vascular Surgery

## 2019-02-24 LAB — PREPARE PLATELET PHERESIS: Unit division: 0

## 2019-02-24 LAB — HEPATIC FUNCTION PANEL
ALT: 768 U/L — ABNORMAL HIGH (ref 0–44)
AST: >10000 U/L — ABNORMAL HIGH (ref 15–41)
Albumin: 1.9 g/dL — ABNORMAL LOW (ref 3.5–5.0)
Alkaline Phosphatase: 285 U/L — ABNORMAL HIGH (ref 38–126)
Bilirubin, Direct: 1.8 mg/dL — ABNORMAL HIGH (ref 0.0–0.2)
Indirect Bilirubin: 0.5 mg/dL (ref 0.3–0.9)
Total Bilirubin: 2.3 mg/dL — ABNORMAL HIGH (ref 0.3–1.2)
Total Protein: 3.4 g/dL — ABNORMAL LOW (ref 6.5–8.1)

## 2019-02-24 LAB — PREPARE FRESH FROZEN PLASMA
Unit division: 0
Unit division: 0

## 2019-02-24 LAB — POCT I-STAT 7, (LYTES, BLD GAS, ICA,H+H)
Acid-base deficit: 10 mmol/L — ABNORMAL HIGH (ref 0.0–2.0)
Acid-base deficit: 3 mmol/L — ABNORMAL HIGH (ref 0.0–2.0)
Acid-base deficit: 5 mmol/L — ABNORMAL HIGH (ref 0.0–2.0)
Bicarbonate: 16.2 mmol/L — ABNORMAL LOW (ref 20.0–28.0)
Bicarbonate: 22.4 mmol/L (ref 20.0–28.0)
Bicarbonate: 21.9 mmol/L (ref 20.0–28.0)
Calcium, Ion: 0.93 mmol/L — ABNORMAL LOW (ref 1.15–1.40)
Calcium, Ion: 0.89 mmol/L — CL (ref 1.15–1.40)
Calcium, Ion: 0.83 mmol/L — CL (ref 1.15–1.40)
HCT: 29 % — ABNORMAL LOW (ref 36.0–46.0)
HCT: 35 % — ABNORMAL LOW (ref 36.0–46.0)
HCT: 34 % — ABNORMAL LOW (ref 36.0–46.0)
Hemoglobin: 9.9 g/dL — ABNORMAL LOW (ref 12.0–15.0)
Hemoglobin: 11.9 g/dL — ABNORMAL LOW (ref 12.0–15.0)
Hemoglobin: 11.6 g/dL — ABNORMAL LOW (ref 12.0–15.0)
O2 Saturation: 85 %
O2 Saturation: 89 %
O2 Saturation: 87 %
Patient temperature: 35.7
Patient temperature: 36.4
Patient temperature: 35
Potassium: 4.2 mmol/L (ref 3.5–5.1)
Potassium: 4.2 mmol/L (ref 3.5–5.1)
Potassium: 5.1 mmol/L (ref 3.5–5.1)
Sodium: 144 mmol/L (ref 135–145)
Sodium: 141 mmol/L (ref 135–145)
Sodium: 138 mmol/L (ref 135–145)
TCO2: 17 mmol/L — ABNORMAL LOW (ref 22–32)
TCO2: 24 mmol/L (ref 22–32)
TCO2: 23 mmol/L (ref 22–32)
pCO2 arterial: 34.6 mmHg (ref 32.0–48.0)
pCO2 arterial: 40 mmHg (ref 32.0–48.0)
pCO2 arterial: 43 mmHg (ref 32.0–48.0)
pH, Arterial: 7.272 — ABNORMAL LOW (ref 7.350–7.450)
pH, Arterial: 7.353 (ref 7.350–7.450)
pH, Arterial: 7.304 — ABNORMAL LOW (ref 7.350–7.450)
pO2, Arterial: 52 mmHg — ABNORMAL LOW (ref 83.0–108.0)
pO2, Arterial: 58 mmHg — ABNORMAL LOW (ref 83.0–108.0)
pO2, Arterial: 52 mmHg — ABNORMAL LOW (ref 83.0–108.0)

## 2019-02-24 LAB — CBC
HCT: 36.9 % (ref 36.0–46.0)
Hemoglobin: 12.4 g/dL (ref 12.0–15.0)
MCH: 31.1 pg (ref 26.0–34.0)
MCHC: 33.6 g/dL (ref 30.0–36.0)
MCV: 92.5 fL (ref 80.0–100.0)
Platelets: 100 10*3/uL — ABNORMAL LOW (ref 150–400)
RBC: 3.99 MIL/uL (ref 3.87–5.11)
RDW: 17.2 % — ABNORMAL HIGH (ref 11.5–15.5)
WBC: 24.9 10*3/uL — ABNORMAL HIGH (ref 4.0–10.5)
nRBC: 4.4 % — ABNORMAL HIGH (ref 0.0–0.2)

## 2019-02-24 LAB — RENAL FUNCTION PANEL
Albumin: 2.2 g/dL — ABNORMAL LOW (ref 3.5–5.0)
Albumin: 1.8 g/dL — ABNORMAL LOW (ref 3.5–5.0)
Anion gap: 23 — ABNORMAL HIGH (ref 5–15)
Anion gap: 24 — ABNORMAL HIGH (ref 5–15)
BUN: 12 mg/dL (ref 6–20)
BUN: 8 mg/dL (ref 6–20)
CO2: 22 mmol/L (ref 22–32)
CO2: 22 mmol/L (ref 22–32)
Calcium: 7 mg/dL — ABNORMAL LOW (ref 8.9–10.3)
Calcium: 6.3 mg/dL — CL (ref 8.9–10.3)
Chloride: 98 mmol/L (ref 98–111)
Chloride: 92 mmol/L — ABNORMAL LOW (ref 98–111)
Creatinine, Ser: 2.58 mg/dL — ABNORMAL HIGH (ref 0.44–1.00)
Creatinine, Ser: 1.82 mg/dL — ABNORMAL HIGH (ref 0.44–1.00)
GFR calc Af Amer: 23 mL/min — ABNORMAL LOW (ref >60)
GFR calc Af Amer: 35 mL/min — ABNORMAL LOW (ref >60)
GFR calc non Af Amer: 20 mL/min — ABNORMAL LOW (ref >60)
GFR calc non Af Amer: 30 mL/min — ABNORMAL LOW (ref >60)
Glucose, Bld: 104 mg/dL — ABNORMAL HIGH (ref 70–99)
Glucose, Bld: 109 mg/dL — ABNORMAL HIGH (ref 70–99)
Phosphorus: 6 mg/dL — ABNORMAL HIGH (ref 2.5–4.6)
Phosphorus: 6.3 mg/dL — ABNORMAL HIGH (ref 2.5–4.6)
Potassium: 4.2 mmol/L (ref 3.5–5.1)
Potassium: 4.5 mmol/L (ref 3.5–5.1)
Sodium: 143 mmol/L (ref 135–145)
Sodium: 138 mmol/L (ref 135–145)

## 2019-02-24 LAB — BPAM FFP
Blood Product Expiration Date: 202008152359
Blood Product Expiration Date: 202008152359
ISSUE DATE / TIME: 202008121246
ISSUE DATE / TIME: 202008121246
Unit Type and Rh: 5100
Unit Type and Rh: 5100

## 2019-02-24 LAB — LACTIC ACID, PLASMA
Lactic Acid, Venous: >11 mmol/L (ref 0.5–1.9)
Lactic Acid, Venous: >11 mmol/L (ref 0.5–1.9)
Lactic Acid, Venous: >11 mmol/L (ref 0.5–1.9)
Lactic Acid, Venous: >11 mmol/L (ref 0.5–1.9)

## 2019-02-24 LAB — BPAM PLATELET PHERESIS
Blood Product Expiration Date: 202008142359
ISSUE DATE / TIME: 202008121842
Unit Type and Rh: 6200

## 2019-02-24 LAB — MAGNESIUM: Magnesium: 1.8 mg/dL (ref 1.7–2.4)

## 2019-02-24 LAB — GLUCOSE, CAPILLARY
Glucose-Capillary: 104 mg/dL — ABNORMAL HIGH (ref 70–99)
Glucose-Capillary: 49 mg/dL — ABNORMAL LOW (ref 70–99)
Glucose-Capillary: 57 mg/dL — ABNORMAL LOW (ref 70–99)
Glucose-Capillary: 97 mg/dL (ref 70–99)
Glucose-Capillary: 99 mg/dL (ref 70–99)

## 2019-02-24 LAB — PROTIME-INR
INR: 5.1 (ref 0.8–1.2)
Prothrombin Time: 46.6 seconds — ABNORMAL HIGH (ref 11.4–15.2)

## 2019-02-24 LAB — PATHOLOGIST SMEAR REVIEW

## 2019-02-24 MED ORDER — ACETAMINOPHEN 325 MG PO TABS: 650.0000 mg | ORAL_TABLET | Freq: Four times a day (QID) | ORAL | Status: DC | PRN

## 2019-02-24 MED ORDER — GLYCOPYRROLATE 1 MG PO TABS
1.0000 mg | ORAL_TABLET | ORAL | Status: DC | PRN
  Filled 2019-02-24: qty 1

## 2019-02-24 MED ORDER — LORAZEPAM 2 MG/ML IJ SOLN: 1.0000 mg | INTRAMUSCULAR | Status: DC | PRN

## 2019-02-24 MED ORDER — GLYCOPYRROLATE 0.2 MG/ML IJ SOLN: 0.2000 mg | INTRAMUSCULAR | Status: DC | PRN

## 2019-02-24 MED ORDER — HALOPERIDOL LACTATE 2 MG/ML PO CONC
0.5000 mg | ORAL | Status: DC | PRN
  Filled 2019-02-24: qty 0.3

## 2019-02-24 MED ORDER — ACETAMINOPHEN 650 MG RE SUPP: 650.0000 mg | Freq: Four times a day (QID) | RECTAL | Status: DC | PRN

## 2019-02-24 MED ORDER — HALOPERIDOL LACTATE 5 MG/ML IJ SOLN: 0.5000 mg | INTRAMUSCULAR | Status: DC | PRN

## 2019-02-24 MED ORDER — CALCIUM GLUCONATE-NACL 2-0.675 GM/100ML-% IV SOLN
2.0000 g | Freq: Once | INTRAVENOUS | Status: AC
  Administered 2019-02-24: 10:00:00 2000 mg via INTRAVENOUS
  Filled 2019-02-24: qty 100

## 2019-02-24 MED ORDER — VITAL HIGH PROTEIN PO LIQD: 1000.0000 mL | ORAL | Status: DC

## 2019-02-24 MED ORDER — DIPHENHYDRAMINE HCL 50 MG/ML IJ SOLN: 25.0000 mg | INTRAMUSCULAR | Status: DC | PRN

## 2019-02-24 MED ORDER — FLUDROCORTISONE 0.1 MG/ML ORAL SUSPENSION: 0.1000 mg | Freq: Every day | ORAL | Status: DC

## 2019-02-24 MED ORDER — PRISMASOL BGK 4/2.5 32-4-2.5 MEQ/L REPLACEMENT SOLN
Status: DC
  Administered 2019-02-24: 10:00:00 via INTRAVENOUS_CENTRAL
  Filled 2019-02-24 (×3): qty 5000

## 2019-02-24 MED ORDER — POLYVINYL ALCOHOL 1.4 % OP SOLN
1.0000 [drp] | Freq: Four times a day (QID) | OPHTHALMIC | Status: DC | PRN
  Filled 2019-02-24: qty 15

## 2019-02-24 MED ORDER — DEXTROSE 50 % IV SOLN
INTRAVENOUS | Status: AC
  Administered 2019-02-24: 12:00:00 50 mL
  Filled 2019-02-24: qty 50

## 2019-02-24 MED ORDER — VITAL AF 1.2 CAL PO LIQD
1000.0000 mL | ORAL | Status: DC
  Administered 2019-02-24: 1000 mL

## 2019-02-24 MED ORDER — LORAZEPAM 2 MG/ML PO CONC: 1.0000 mg | ORAL | Status: DC | PRN

## 2019-02-24 MED ORDER — HALOPERIDOL 0.5 MG PO TABS
0.5000 mg | ORAL_TABLET | ORAL | Status: DC | PRN
  Filled 2019-02-24: qty 1

## 2019-02-24 MED ORDER — DEXTROSE 50 % IV SOLN
INTRAVENOUS | Status: AC
  Administered 2019-02-24: 08:00:00 50 mL
  Filled 2019-02-24: qty 50

## 2019-02-24 MED ORDER — SODIUM CHLORIDE 0.9 % IV SOLN
2.0000 g | Freq: Two times a day (BID) | INTRAVENOUS | Status: DC
  Administered 2019-02-24: 12:00:00 2 g via INTRAVENOUS
  Filled 2019-02-24 (×2): qty 2

## 2019-02-24 MED ORDER — FLUDROCORTISONE ACETATE 0.1 MG PO TABS
0.1000 mg | ORAL_TABLET | Freq: Every day | ORAL | Status: DC
  Administered 2019-02-24: 14:00:00 0.1 mg
  Filled 2019-02-24: qty 1

## 2019-02-24 MED ORDER — LORAZEPAM 1 MG PO TABS: 1.0000 mg | ORAL_TABLET | ORAL | Status: DC | PRN

## 2019-02-24 MED ORDER — VANCOMYCIN HCL 500 MG IV SOLR: 500.0000 mg | INTRAVENOUS | Status: DC

## 2019-02-24 MED ORDER — PRISMASOL BGK 4/2.5 32-4-2.5 MEQ/L IV SOLN
INTRAVENOUS | Status: DC
  Administered 2019-02-24 (×3): via INTRAVENOUS_CENTRAL
  Filled 2019-02-24 (×8): qty 5000

## 2019-02-24 MED ORDER — VANCOMYCIN HCL IN DEXTROSE 750-5 MG/150ML-% IV SOLN
750.0000 mg | Freq: Once | INTRAVENOUS | Status: AC
  Administered 2019-02-24: 14:00:00 750 mg via INTRAVENOUS
  Filled 2019-02-24: qty 150

## 2019-02-25 LAB — BPAM RBC
Blood Product Expiration Date: 202008292359
Blood Product Expiration Date: 202008292359
Blood Product Expiration Date: 202008292359
Blood Product Expiration Date: 202008312359
Blood Product Expiration Date: 202008312359
Blood Product Expiration Date: 202008312359
Blood Product Expiration Date: 202009022359
Blood Product Expiration Date: 202009022359
Blood Product Expiration Date: 202009022359
Blood Product Expiration Date: 202009022359
Blood Product Expiration Date: 202009092359
Blood Product Expiration Date: 202009092359
ISSUE DATE / TIME: 202008100726
ISSUE DATE / TIME: 202008100726
ISSUE DATE / TIME: 202008101024
ISSUE DATE / TIME: 202008101024
ISSUE DATE / TIME: 202008101114
ISSUE DATE / TIME: 202008101114
ISSUE DATE / TIME: 202008112201
ISSUE DATE / TIME: 202008112201
ISSUE DATE / TIME: 202008120915
ISSUE DATE / TIME: 202008120915
ISSUE DATE / TIME: 202008121246
ISSUE DATE / TIME: 202008121246
Unit Type and Rh: 5100
Unit Type and Rh: 5100
Unit Type and Rh: 5100
Unit Type and Rh: 5100
Unit Type and Rh: 5100
Unit Type and Rh: 5100
Unit Type and Rh: 5100
Unit Type and Rh: 5100
Unit Type and Rh: 5100
Unit Type and Rh: 5100
Unit Type and Rh: 5100
Unit Type and Rh: 5100

## 2019-02-25 LAB — TYPE AND SCREEN
ABO/RH(D): O POS
Antibody Screen: NEGATIVE
Unit division: 0
Unit division: 0
Unit division: 0
Unit division: 0
Unit division: 0
Unit division: 0
Unit division: 0
Unit division: 0
Unit division: 0
Unit division: 0
Unit division: 0
Unit division: 0

## 2019-02-25 MED FILL — Sodium Chloride Irrigation Soln 0.9%: Qty: 3000 | Status: AC

## 2019-02-25 MED FILL — Heparin Sodium (Porcine) Inj 1000 Unit/ML: INTRAMUSCULAR | Qty: 30 | Status: AC

## 2019-02-25 MED FILL — Sodium Chloride IV Soln 0.9%: INTRAVENOUS | Qty: 1000 | Status: AC

## 2019-03-01 LAB — CULTURE, BLOOD (ROUTINE X 2)
Culture: NO GROWTH
Culture: NO GROWTH

## 2019-03-08 ENCOUNTER — Telehealth: Payer: Self-pay | Admitting: Vascular Surgery

## 2019-03-08 NOTE — Telephone Encounter (Signed)
I spoke with Ysidro Evert at [employer] he will be by to pick the signed death certificate.

## 2019-03-15 NOTE — Anesthesia Postprocedure Evaluation (Signed)
Anesthesia Post Note  Patient: Candice G Masaki  Procedure(s) Performed: AORTOBIFEMORAL BYPASS GRAFT Using Hemashield Graft (N/A Abdomen) Aortic Endarterecetomy (N/A Abdomen)     Patient location during evaluation: SICU Anesthesia Type: General Level of consciousness: sedated and patient remains intubated per anesthesia plan Pain management: pain level controlled Vital Signs Assessment: post-procedure vital signs reviewed and stable Respiratory status: patient remains intubated per anesthesia plan and patient on ventilator - see flowsheet for VS Cardiovascular status: stable Anesthetic complications: no    Last Vitals:  Vitals:   05-Mar-2019 1529 March 05, 2019 1530  BP: (!) 81/66   Pulse: (!) 102 96  Resp: (!) 32 (!) 32  Temp:  (!) 34.4 C  SpO2: (!) 80% (!) 77%    Last Pain:  Vitals:   Mar 05, 2019 0800  TempSrc: Esophageal  PainSc:                  Reace Breshears COKER

## 2019-03-15 NOTE — Addendum Note (Signed)
Addendum  created 02-27-2019 1243 by Josephine Igo, CRNA   Order list changed

## 2019-03-15 NOTE — Progress Notes (Signed)
Family decided to pursue comfort measures for patient. CRRT stopped at 1720. Fentanyl drip increased for patient comfort. Patient terminally extubated at 1723 by RT. Pressors decreased by half at 1728 and turned off at 1738. Time of death 18. Pronounced by Larena Glassman, RN and Penny Pia, RN after no breath or heart sounds were heard for 1 minute. CDS notified. MD notified. Family remains at bedside.   Joellen Jersey, RN

## 2019-03-15 NOTE — Progress Notes (Signed)
Nutrition Follow-up  RD working remotely.  DOCUMENTATION CODES:   Underweight, suspect malnutrition but unable to confirm without NFPE  INTERVENTION:   Start trickle tube feeds: - Vital AF 1.2 @ 20 ml/hr (480 ml/day) via NG tube  Trickle tube feeding regimen provides 576 kcal, 36 grams of protein, and 389 ml of H2O (46% kcal needs, 55% protein needs).   Goal tube feeding regimen: - Vital AF 1.2 @ 45 ml/hr (1080 ml/day) via NG tube  Goal tube feeding regimen would provide 1296 kcal, 81 grams of protein, and 876 ml of H2O.   NUTRITION DIAGNOSIS:   Increased nutrient needs related to post-op healing as evidenced by estimated needs.  Ongoing, being addressed via initiation of trickle tube feeds  GOAL:   Patient will meet greater than or equal to 90% of their needs  Progressing with initiation of trickle tube feeds  MONITOR:   Vent status, Labs, Weight trends, Skin, I & O's  REASON FOR ASSESSMENT:   Consult Enteral/tube feeding initiation and management (trickle tube feeds)  ASSESSMENT:   58 year old female who presented on 8/10 for aortobifemoral bypass graft and aortic endarterectomy. Surgery complicated by EBL of 2694. Pt was resuscitated with 6u pRBCs, 4u FFP, 2u platelets, 2u cryoprecipitate, and 1100 mL Cell Saver. PMH of PAD, HTN, HLD, CVA in 2012.  8/11 - did not tolerate CRRT x 2 due to hypotension 8/12 - s/p ex-lap with findings of viable small bowel but slightly underperfused, viable colon, no obvious source of lactic acidosis 8/13 - CRRT restarted  RD consulted to start trickle tube feeds. Discussed with RN.  Pt with grim prognosis per Vascular Surgery. Pt is tolerating CRRT this AM. Lactic acid still elevated. Per Vascular Surgery, no obvious source and possibly from initial insult of surgery.  NG tube remains in right nare.  Per RN edema assessment, pt with non-pitting edema to BUE and BLE.  Patient is currently intubated on ventilator support MV:  12.5 L/min Temp (24hrs), Avg:94.9 F (34.9 C), Min:92.8 F (33.8 C), Max:97.5 F (36.4 C) BP (a-line): 91/72 MAP (a-line): 81  Drips: Propofol: none Fentanyl: 7.5 ml/hr Levophed: 37.5 ml/hr Neosynephrine: 48.8 ml/hr Vasopressin: 15 ml/hr D10: 25 ml/hr  Medications reviewed and include: Colace, Solu-cortef, SSI q 4 hours, Protonix, calcium gluconate 2 grams once, IV abx  Labs reviewed: calcium ionized 0.89, phosphorus 6.0, lactic acid >11.0 CBG's: 49-125 x 24 hours  UOP: 0 ml x 24 hours CRRT net UF: 1379 ml x 24 hours VAC: 150 ml x 24 hours I/O's: +22.3 L since admit  NUTRITION - FOCUSED PHYSICAL EXAM:  Unable to complete at this time. RD working remotely.  Diet Order:   Diet Order            Diet NPO time specified Except for: Sips with Meds  Diet effective midnight              EDUCATION NEEDS:   No education needs have been identified at this time  Skin:  Skin Assessment: Skin Integrity Issues: Wound Vac: bilateral groin Incisions: abdomen  Last BM:  02/22/19  Height:   Ht Readings from Last 1 Encounters:  02/15/2019 5\' 2"  (1.575 m)    Weight:   Wt Readings from Last 1 Encounters:  02/12/2019 41.1 kg    Ideal Body Weight:  50 kg  BMI:  Body mass index is 16.57 kg/m.  Estimated Nutritional Needs:   Kcal:  8546  Protein:  65-80 grams  Fluid:  >/= 1.3 L  Kate Jablonski Rogers Ditter, MS, RD, LDN Inpatient Clinical Dietitian Pager: [phone] Weekend/After Hours: [phone]  

## 2019-03-15 NOTE — Progress Notes (Signed)
Pharmacy Antibiotic Note  Kristina Mayer is a 58 y.o. female admitted on 02/27/2019 with pneumonia.  Pharmacy has been consulted for Vancomycin and Cefepime dosing.  WBC elevated at 24.9. Patient is currently hypothermic and receiving CRRT. CXR findings show persistent left lung base atelectasis concerning of pneumonia. Patient is also receiving empiric flagyl. Will dose antibiotics using dosing for CRRT.  Plan: Vancomycin 750 mg IV once, then 500 mg IV every 24 hours Cefepime 2 g every 12 hours Monitor cultures, clinical progression, and CRRT/renal function  Height: 5\' 2"  (157.5 cm) Weight: 90 lb 9.7 oz (41.1 kg)(PTA) IBW/kg (Calculated) : 50.1  Temp (24hrs), Avg:95.8 F (35.4 C), Min:93.9 F (34.4 C), Max:97.5 F (36.4 C)  Recent Labs  Lab 02/22/19 0330 02/22/19 1113  02/22/19 1920  02/28/2019 0316  02/19/2019 1424 02/16/2019 1517 03/11/2019 1518 03/03/2019 1838 03/21/2019 0116 March 21, 2019 0349 2019-03-21 1140  WBC 16.3* 22.9*  --  25.5*  --  27.2*  --   --   --   --   --   --  24.9*  --   CREATININE 2.25* 2.60*  --   --   --  3.30*  --  3.30* 3.10* 3.25*  --   --  2.58*  --   LATICACIDVEN 6.2*  --    < > 7.0*   < >  --    < >  --   --  >11.0* >11* >11.0* >11.0* >11.0*   < > = values in this interval not displayed.    Estimated Creatinine Clearance: 15.4 mL/min (A) (by C-G formula based on SCr of 2.58 mg/dL (H)).    No Known Allergies  Antimicrobials this admission: Vancomycin 8/13 >>  Cefepime 8/13 >>  Flagyl 8/12 >> Ceftriaxone 8/12 >> 8/13 Cefazolin x1 post-op  Dose adjustments this admission: None  Microbiology results: 8/13 BCx:  8/13 Tracheal Aspirate:    Richardine Service, PharmD PGY1 Pharmacy Resident Phone: [phone] March 21, 2019  4:09 PM  Please check [email or web address] for unit-specific pharmacy phone numbers.

## 2019-03-15 NOTE — Progress Notes (Signed)
RT NOTE: RT increased PEEP to 12 per MD order. RT will continue to monitor.

## 2019-03-15 NOTE — Discharge Summary (Signed)
[facility] Discharge Summary   Patient ID:  Kristina Mayer MRN: [id number] DOB/AGE: 16-Apr-1961 58 y.o.  Admit date: 03/02/2019 Discharge date: 28-Feb-2019 Date of Surgery: 02/21/2019 Surgeon: Surgeon(s): Marty Heck, MD  Admission Diagnosis: S/P aortobifemoral bypass surgery [Z95.828]  Discharge Diagnoses:  S/P aortobifemoral bypass surgery [Z95.828]  Secondary Diagnoses: Past Medical History:  Diagnosis Date  . Allergic rhinitis   . Allergy   . Aortoiliac occlusive disease ([place])   . Blood transfusion   . Cervical cancer ([place])   . Chronic headaches   . Coronary artery disease   . Cyst    by left eye  . Hyperlipidemia   . Hypertension   . Stroke ([place]) 2012   . 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 She hashad leg pain for 3 years initially described as claudication progressing since late last year now with some early rest pain symptoms. When I examined her last weekshe had no femoral pulses and no pedal pulses all consistent with aortic occlusion. Critical limb ischemia of the bilateral lower extremities with aortoiliac occlusive disease and bilateral lower extremity rest pain.   She was scheduled for Aortobifemoral bypass.   Hospital Course:  Kristina Mayer is a 58 y.o. female is  Procedure(s): S/P  Hypovolemic shock status post aortic endarterectomy and aortobifemoral bypass  03/06/2019 Indications: Patient is a 58 year old female that underwent aortic endarterectomy and aortobifemoral bypass on Monday for occlusive disease.  Ultimately she had to have prolonged supra-renal and supraceliac clamp.  She initially did well in the ICU and then postop day 1 became  uric with worsening acidosis.  We have attempted maximal conservative management as well as CRRT with ongoing acidosis and hemodynamic instability.  She presents today to operating room for exploratory laparotomy to rule out any underlying ischemic bowel or other obvious defect.   Exploratory Laparotomy Application Of Wound Vac Started on CRRT with prior intolerance.     Liver enzymes are rising.  Multisystem organ failure.  Ongoing pressor requirements.  Patient has been made DNR.  She did tolerate CRRT overnight.  Remains on 3 pressors critically ill with grim prognosis.  Lactic acid still elevated with no obvious source and assume is from the initial insult of surgery.  Difficulty finding pedal signals today but has excellent popliteal signals and I think it is from her vasospasm from pressor requirements.  Family decided to pursue comfort measures for patient. CRRT stopped at 1720. Fentanyl drip increased for patient comfort. Patient terminally extubated at 1723 by RT. Pressors decreased by half at 1728 and turned off at 1738. Time of death 78. Pronounced by Larena Glassman, RN and Penny Pia, RN after no breath or heart sounds were heard for 1 minute. CDS notified. MD notified. Family remains at bedside.  February 28, 2019 06:354.  Consults:  Treatment Team:  Pccm, Md, MD Donato Heinz, MD      Significant Diagnostic Studies: CBC Lab Results  Component Value Date   WBC 24.9 (H) Feb 28, 2019   HGB 11.6 (L) 02/28/2019   HCT 34.0 (L) 02/28/2019   MCV 92.5 Feb 28, 2019   PLT 100 (L) 02/28/19    BMET    Component Value Date/Time   NA 138 02/28/19 1549   K 4.5 02/28/2019 1549  CL 92 (L) 2019/03/09 1549   CO2 22 March 09, 2019 1549   GLUCOSE 109 (H) March 09, 2019 1549   GLUCOSE 109 12/09/2013 1027   BUN 8 March 09, 2019 1549   CREATININE 1.82 (H) 03/09/19 1549   CALCIUM 6.3 (LL) Mar 09, 2019 1549   GFRNONAA 30 (L) March 09, 2019 1549   GFRAA 35 (L) 03/09/19 1549   COAG Lab Results   Component Value Date   INR 5.1 (HH) 03-09-2019   INR 3.9 (H) 03/13/2019   INR 9.7 (HH) 03/06/2019     Disposition:  Discharge to :[facility]   Allergies as of 03/09/19   No Known Allergies     Medication List    ASK your doctor about these medications   aspirin EC 325 MG tablet Take 325 mg by mouth daily.   atorvastatin 40 MG tablet Commonly known as: LIPITOR Take 1 tablet (40 mg total) by mouth daily.   butalbital-acetaminophen-caffeine 50-325-40 MG tablet Commonly known as: Fioricet Take 1 tablet by mouth 2 (two) times daily as needed for headache.   clopidogrel 75 MG tablet Commonly known as: PLAVIX Take 75 mg by mouth daily.   DULoxetine 30 MG capsule Commonly known as: CYMBALTA Take 30 mg by mouth 2 (two) times daily.   lisinopril 20 MG tablet Commonly known as: ZESTRIL Take 20 mg by mouth daily.   loratadine 10 MG tablet Commonly known as: CLARITIN Take 1 tablet (10 mg total) by mouth daily.   metoprolol succinate 25 MG 24 hr tablet Commonly known as: Toprol XL Take 1 tablet (25 mg total) by mouth daily.   OVER THE COUNTER MEDICATION Apply 1 application topically daily. Theraworx topical cream   potassium chloride 10 MEQ CR capsule Commonly known as: MICRO-K Take 10 mEq by mouth daily.      Verbal and written Discharge instructions given to the patient. Wound care per Discharge AVS   Signed: Roxy Horseman 03/02/2019, 10:52 AM

## 2019-03-15 NOTE — Progress Notes (Signed)
CRITICAL VALUE ALERT  Critical Value:  Ca 6.3  Date & Time Notied:  March 09, 2019 1632  Provider Notified: Dr. Carlis Abbott to be notified.  Orders Received/Actions taken: Patient to be made comfort care.   Joellen Jersey, RN

## 2019-03-15 NOTE — Procedures (Signed)
Extubation Procedure Note  Patient Details:   Name: Kristina Mayer DOB: 03-17-1961 MRN: [id number]   Airway Documentation:    Vent end date: 03/08/19 Vent end time: 1723   Evaluation  O2 sats: transiently fell during during procedure Complications: No apparent complications Patient did not tolerate procedure well. Bilateral Breath Sounds: Clear, Diminished   No   Patient was compassionately extubated to a 2L [place] per CCM orders with family and RN at the bedside.    Claretta Fraise 08-Mar-2019, 5:23 PM

## 2019-03-15 NOTE — Progress Notes (Signed)
NAME:  Kristina Mayer, MRN:  [id number], DOB:  [birth year], LOS: 3 ADMISSION DATE:  03/08/2019, CONSULTATION DATE:  03/01/2019 REFERRING MD:  Dr. Carlis Abbott, Vascular Surgery, CHIEF COMPLAINT:  CLI of BLE and aortoiliac disease, S/p aortobifemoral bypass and aortic endarterectomy   Brief History   58F with PHMx of HTN, hyperlipidemia, CVA (2012), hx of PDA repair, CLI of BLE, aortoiliac occlusive disease, who is now s/p aortobifemoral bypass and aortic endarterectomy of infra-renal and supra-renal aorta (03/06/2019). Surgery complicated by EBL of 2229. Patient was resuscitated with 6u pRBCs, 4u FFP, 2u platelets, 2u cryoprecipitate, and 1100 mL Cell Saver. Transferred to ICU post-operatively, in stable condition, still intubated.  History of present illness   Kristina Mayer is a 58 year old woman with a PHMx significant for HTN, hyperlipidemia, CVA (2012), hx of PDA repair, tobacco use disorder, CLI of BLE, aortoiliac occlusive disease, who is now s/p aortobifemoral bypass and aortic endarterectomy of infra-renal and supra-renal aorta (02/15/2019). Surgery complicated by EBL of 7989. Patient was resuscitated with 6u pRBCs, 4u FFP, 2u platelets, 2u cryoprecipitate, and 1100 mL Cell Saver. Transferred to ICU post-operatively, in stable condition, still intubated.  Past Medical History  Hypertension Hyperlipidemia Chronic Limb Ischemia Aortoiliac occlusive disease Tobacco use disorder History of PDA repair  Significant Hospital Events   8/10: Aortobifemoral bypass and aortic endarterectomy  8/12: Exploratory laparotomy, started CRRT   Consults:  Vascular Surgery PCCM Nephrology  Procedures:  8/10: Aortobifemoral bypass and aortic endarterectomy  ETT 8/10 >>  Left IJ HD catheter 8/11 >>  8/12: Exploratory laparotomy  Significant Diagnostic Tests:  CT Angiography (02/02/19): Advanced aortic atherosclerosis, with bulky calcified and soft plaque of the juxtarenal aorta/infrarenal aorta nearly filling  the aortic lumen, and contributing to occlusion just above the IMA takeoff. 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 Bilateral left greater than right renal arterial disease with high-grade stenosis of the main left renal artery and associated renal cortical thinning. Bilateral iliac arterial disease with at least 50% narrowing at 2 locations of the right common iliac artery, and more mild left common iliac arterial disease.  Echo (02/07/19): EF 55-60% with some diastolic dysfunction noted  Lexiscan (02/07/19): Myocardial perfusion imaging normal, low risk study.  Echo (02/22/19): Normal LV systolic function with EF 55-60%. Cavity size normal. Left ventricular diastolic parameters normal. RV normal systolic function. Cavity normal. No increase in right ventricular wall thickness. Trivial pericardial effusion lateral to RV although likely mostly prominent epicardial fat. Mild thickening of the mitral valve leaflet. Mild calcification of the mitral valve leaflet. Aortic valve is tricuspid. Mild thickening of the aortic valve. Mild calcification of the aortic valve.  Micro Data:  SARS CoV 2 (02/17/19): Negative MRSA PCR (02/15/19): Negative  Antimicrobials:  Cefazolin (8/10) for surgical prophylaxis Ceftriaxone (8/12- ) Metronidazole (8/12- )  Interim history/subjective:  In the interim, patient underwent exploratory laparotomy on 8/12 with Vascular Surgery; no obvious source and no intestinal ischemia observed, although there was significant ascites. Patient received an additional 2u pRBCs, 2u FFP, and 1u platelets yesterday (8/12).  Patient remains on 3 pressors with lactic acid > 11. She remains anuric, but was able to tolerate CRRT overnight. FiO2 was increased to 100% overnight.  Discussion with patient's daughter at bedside on 02-26-2019 regarding goals of care. Had  previously established DNR status. Daughter expressed that she has previously had conversations with the patient where they discussed the  patient's wishes regarding goals of care given the substantial risks involved with her surgery. Daughter stated that patient would not want to continue like this for too much longer. Daughter plans to await additional family support to arrive at the hospital before making decisions regarding potential transfer of focus to comfort measures.  Objective   Blood pressure (!) 47/37, pulse (!) 108, temperature (!) 94.5 F (34.7 C), resp. rate (!) 32, height 5\' 2"  (1.575 m), weight 41.1 kg, SpO2 100 %. CVP:  [12 mmHg-16 mmHg] 12 mmHg  Vent Mode: PRVC FiO2 (%):  [50 %-100 %] 100 % Set Rate:  [32 bmp] 32 bmp Vt Set:  [400 mL] 400 mL PEEP:  [8 cmH20-12 cmH20] 12 cmH20 Plateau Pressure:  [23 cmH20-26 cmH20] 24 cmH20   Intake/Output Summary (Last 24 hours) at 03-13-2019 1438 Last data filed at 03-13-19 1400 Gross per 24 hour  Intake 4829.61 ml  Output 1940 ml  Net 2889.61 ml   Filed Weights   02/25/2019 1050  Weight: 41.1 kg    Examination: General: Intubated, sedated HENT: ETT in place. NG tube in place. Pupils equal, reactive. Neck: Supple. JVD difficult to assess.  Lungs: Clear to anterior auscultation bilaterally. Equal breath sounds bilaterally.  Cardiovascular: Normal rate, normal S1, S2, no murmurs appreciated. Unable to palpate dorsalis pedis pulse bilaterally. Abdomen: Soft, non-distended. Positive bowel sounds. Midline abdominal incision with some serous drainage. Wound vac in place on bilateral groin incisions. Extremities: Bilateral feet have dusky appearance. Bilateral lower extremities are cool with shiny skin, loss of hair, and muscle atrophy consistent with arterial insufficiency. Skin: Swelling noted in face and upper extremities. Neuro: Opens eyes to voice. Does not follow commands. GU: Foley catheter in place.  Resolved Hospital Problem  list     Assessment & Plan:  Kristina Mayer is a 58 year old woman with a PHMx significant for HTN, hyperlipidemia, CVA (2012), hx of PDA repair, tobacco use disorder, CLI of BLE, aortoiliac occlusive disease, who is now s/p aortobifemoral bypass and aortic endarterectomy of infra-renal and supra-renal aorta (02/20/2019). Surgery complicated by significant bleeding and prolonged supra-renal and supra-celiac clamping. Patient was resuscitated with 6u pRBCs, 4u FFP, 2u platelets, 2u cryoprecipitate, and 1100 mL Cell Saver prior to transfer to ICU. Transferred in stable condition, intubated.   Refractory Shock Suspect distributive etiology. Lactic acid >11 and no obvious source noted during exploratory laparotomy 8/12. VAP is possible given CXR findings and leukocytosis. Additional consideration is systemic inflammatory response from surgical insult. Unable to obtain doppler signal in dorsalis pedis bilaterally today and feet have developed a dusky appearance, likely due to vasopressors.  - Initiate vancomycin (8/13 -). - Initiate cefepime (8/13- ). - Continue metronidazole. - Continue vasopressor support. - Obtain blood cultures and sputum culture. - Follow culture data. - Trend lactic acid. - Continue CRRT.   Acute respiratory failure requiring mechanical ventilation following complex vascular surgery, concern for pneumonia Patient remains intubated s/p aortobifemoral bypass and aortic endarterectomy (8/10) and exploratory laparotomy (8/12). FiO2 was increased to 100% overnight and repeat blood gas this AM reveals PaO2 of 52. Additionally, concern for pneumonia as outlined above. - Continue ventilatory support, increase PEEP to 12. - Antibiotics as above. - Continue fentanyl gtt. - VAP precautions.  Multi-organ failure including acute kidney injury, worsening hyperkalemia and acidemia, and acute liver failure. Suspect ATN secondary to decreased tissue perfusion given supra-renal clamp during  surgery. Patient remains anuric, CRRT able to initiated on 8/12. Remains acidotic with lactic acid > 11 and hypotensive  requiring 3 pressors. AST > 10,000 and INR 3.9. - Continue supportive care with CRRT, appreciate nephrology assistance. - Follow BMP and urine output. - Follow hepatic function panel. - Continue norepinephrine, phenylephrine, and vasopressin to maintain adequate tissue perfusion.  Goals of Care Discussion with patient's daughter at bedside on 03/23/2019 regarding goals of care. Had previously established DNR status. Daughter expressed that she has previously had conversations with the patient where they discussed the patient's wishes given the risks involved with the surgery. Daughter stated that patient would not want to continue like this for too much longer. Daughter plans to await additional family support to arrive at the hospital before making decisions regarding potential transfer of focus to comfort measures. - Continue discussions with family.  Anemia Patient with acute blood loss during surgery, resuscitation as above. Hemoglobin 12.4 s/p 2u pRBCs yesterday.  - Continue to follow and transfuse as needed. - Monitor for signs/symptoms of bleeding.  Transaminitis AST has trended up to >10,000 with ALT 768, concerning for ischemic hepatitis given prolonged supra-celiac clamp time. - Continue to monitor and provide supportive care.  Thrombocytopenia Platelets 100 s/p 1u transfusion yesterday. Suspect functional consumption given acute illness, lower concern for HIT. - Continue to follow and transfuse as needed.  Hypoglycemia - Continue D10 infusion. - Start trickle feeds.  Hypomagnesemia -Replete magnesium as needed.  Nutrition -Start trickle feeds.  Tobacco Use Disorder, likely COPD Patient has 40 pack-year smoking history. CXR suggestive of COPD, but no formal diagnosis. - Referral for outpatient PFTs. - Smoking cessation support and counseling.  Best  practice:  Diet: NPO Pain/Anxiety/Delirium protocol (if indicated): fentanyl gtt VAP protocol (if indicated): Yes DVT prophylaxis: heparin GI prophylaxis: pantoprazole Glucose control: SSI sensitive Mobility: Bedrest Code Status: Full code Family Communication: Discussed with patient's daughter at bedside Disposition: ICU  Labs   CBC: Recent Labs  Lab 02/22/19 0330  02/22/19 1113 02/22/19 1920  02/26/2019 0316  03/01/2019 1523 02/27/2019 2155 03-23-19 0349 03/23/2019 0436 03-23-2019 1146  WBC 16.3*  --  22.9* 25.5*  --  27.2*  --   --   --  24.9*  --   --   HGB 8.4*   < > 8.0* 5.9*   < > 12.9   < > 9.2* 9.9* 12.4 11.9* 11.6*  HCT 24.1*   < > 23.4* 18.0*   < > 37.6   < > 27.0* 29.0* 36.9 35.0* 34.0*  MCV 92.0  --  94.0 100.0  --  90.6  --   --   --  92.5  --   --   PLT 125*  --  113* 88*  --  75*  --   --   --  100*  --   --    < > = values in this interval not displayed.    Basic Metabolic Panel: Recent Labs  Lab 03/08/2019 1630  02/22/19 0330  02/22/19 1113  02/17/2019 0316  02/12/2019 1424 02/17/2019 1517 02/18/2019 1518 03/06/2019 1523 02/21/2019 2155 03/23/19 0349 2019/03/23 0436 23-Mar-2019 1146  NA 148*   < > 145   < > 142   < > 147*   < > 145 146* 148* 149* 144 143 141 138  K 3.4*   < > 5.7*   < > 6.1*   < > 6.1*   < > 5.3* 5.2* 5.2* 5.2* 4.2 4.2 4.2 5.1  CL 107  --  110  --  114*  --  111  --  112* 109  111  --   --  98  --   --   CO2 25  --  20*  --  18*  --  17*  --   --   --  14*  --   --  22  --   --   GLUCOSE 214*  --  163*  --  183*  --  50*  --  76 79 83  --   --  104*  --   --   BUN 6  --  11  --  14  --  16  --  19 17 15   --   --  12  --   --   CREATININE 1.43*  --  2.25*  --  2.60*  --  3.30*  --  3.30* 3.10* 3.25*  --   --  2.58*  --   --   CALCIUM 9.1  --  7.5*  --  7.1*  --  6.6*  --   --   --  7.9*  --   --  7.0*  --   --   MG  --   --  1.3*  --  1.6*  --  1.5*  --   --   --   --   --   --  1.8  --   --   PHOS 5.2*  --   --   --   --   --  6.3*  --   --   --  8.4*  --    --  6.0*  --   --    < > = values in this interval not displayed.   GFR: Estimated Creatinine Clearance: 15.4 mL/min (A) (by C-G formula based on SCr of 2.58 mg/dL (H)). Recent Labs  Lab 02/22/19 1113  02/22/19 1920  02/13/2019 0316  03/09/2019 1838 03-04-19 0116 Mar 04, 2019 0349 2019/03/04 1140  WBC 22.9*  --  25.5*  --  27.2*  --   --   --  24.9*  --   LATICACIDVEN  --    < > 7.0*   < >  --    < > >11* >11.0* >11.0* >11.0*   < > = values in this interval not displayed.    Liver Function Tests: Recent Labs  Lab 02/22/19 0330 02/22/19 1113 02/20/2019 0316 02/22/2019 1518 02/17/2019 2047 03/04/2019 0349 2019/03/04 1140  AST 554* 1,235* 4,508*  --  >10,000*  --  >10,000*  ALT 242* 326* 1,018*  --  1,370*  --  768*  ALKPHOS 39 51 101  --  175*  --  285*  BILITOT 0.5 0.5 1.0  --  2.0*  --  2.3*  PROT 3.1* 3.0* 3.2*  --  3.6*  --  3.4*  ALBUMIN 2.0* 1.9* 1.9* 1.7* 2.3* 2.2* 1.9*   Recent Labs  Lab 02/22/19 0330  AMYLASE 121*   No results for input(s): AMMONIA in the last 168 hours.  ABG    Component Value Date/Time   PHART 7.304 (L) Mar 04, 2019 1146   PCO2ART 43.0 2019-03-04 1146   PO2ART 52.0 (L) 03-04-2019 1146   HCO3 21.9 2019/03/04 1146   TCO2 23 2019/03/04 1146   ACIDBASEDEF 5.0 (H) Mar 04, 2019 1146   O2SAT 87.0 03-04-19 1146     Coagulation Profile: Recent Labs  Lab 03/10/2019 1630 02/20/2019 1745 03/12/2019 1518 03/07/2019 2047  INR SPECIMEN HEMOLYZED. HEMOLYSIS MAY AFFECT INTEGRITY OF RESULTS. 1.6* 9.7* 3.9*    Cardiac Enzymes: No results  for input(s): CKTOTAL, CKMB, CKMBINDEX, TROPONINI in the last 168 hours.  HbA1C: Hgb A1c MFr Bld  Date/Time Value Ref Range Status  03/11/2019 05:30 PM 5.3 4.8 - 5.6 % Final    Comment:    REPEATED TO VERIFY (NOTE) Pre diabetes:          5.7%-6.4% Diabetes:              >6.4% Glycemic control for   <7.0% adults with diabetes   12/09/2013 10:27 AM 5.5 <5.7 % Final    Comment:                                                                           According to the ADA Clinical Practice Recommendations for 2011, whenHbA1c is used as a screening test:   >=6.5%   Diagnostic of Diabetes Mellitus           (if abnormal result is  confirmed) 5.7-6.4%   Increased risk of developing Diabetes Mellitus References:Diagnosis and Classification of Diabetes Mellitus,DiabetesCare,2011,34(Suppl 1):S62-S69 and Standards of Medical Care in        Diabetes - 2011,Diabetes Care,2011,34 (Suppl  1):S11-S61.     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   Surgical History    Past Surgical History:  Procedure Laterality Date   ABDOMINAL HYSTERECTOMY     AORTA - BILATERAL FEMORAL ARTERY BYPASS GRAFT N/A 03/10/2019   Procedure: AORTOBIFEMORAL BYPASS GRAFT Using Hemashield Graft;  Surgeon: Marty Heck, MD;  [facility];  Service: Vascular;  Laterality: N/A;   AORTIC ENDARTERECETOMY N/A 02/19/2019   Procedure: Aortic Endarterecetomy;  Surgeon: Marty Heck, MD;  Location: [facility];  Service: Vascular;  Laterality: N/A;   [place] History   reports that she has been smoking cigarettes. She has a 20.00 pack-year smoking history. She has never used smokeless tobacco. She reports that she does not drink alcohol or use drugs.   Family History   Her family history includes Cancer in her mother; Colon cancer in her paternal aunt; Esophageal cancer in her maternal grandfather; Heart attack in her brother; Heart disease in her brother; Hypertension in her mother and sister;  Stroke in her maternal uncle and mother. There is no history of Rectal cancer or Stomach cancer.   Allergies No Known Allergies   Home Medications  Prior to Admission medications   Medication Sig Start Date End Date Taking? Authorizing Provider  aspirin EC 325 MG tablet Take 325 mg by mouth daily.   Yes [provider]  atorvastatin (LIPITOR) 40 MG tablet Take 1 tablet (40 mg total) by mouth daily. 02/03/19 05/04/19 Yes Miquel Dunn, NP  butalbital-acetaminophen-caffeine (FIORICET) 305 032 7356 MG per tablet Take 1 tablet by mouth 2 (two) times daily as needed for  headache. 02/21/14  Yes Advani, Vernon Prey, MD  clopidogrel (PLAVIX) 75 MG tablet Take 75 mg by mouth daily.    Yes [provider]  DULoxetine (CYMBALTA) 30 MG capsule Take 30 mg by mouth 2 (two) times daily.   Yes [provider]  lisinopril (PRINIVIL,ZESTRIL) 20 MG tablet Take 20 mg by mouth daily.   Yes [provider]  loratadine (CLARITIN) 10 MG tablet Take 1 tablet (10 mg total) by mouth daily. 01/23/14  Yes Advani, Vernon Prey, MD  metoprolol succinate (TOPROL XL) 25 MG 24 hr tablet Take 1 tablet (25 mg total) by mouth daily. 02/03/19  Yes Miquel Dunn, NP  potassium chloride (MICRO-K) 10 MEQ CR capsule Take 10 mEq by mouth daily.   Yes [provider]  OVER THE COUNTER MEDICATION Apply 1 application topically daily. Theraworx topical cream    [provider]     Atilano Ina, MS4

## 2019-03-15 NOTE — Progress Notes (Addendum)
[facility]  Subjective  - Intubated follows with eyes   Objective 94/82 (!) 119 (!) 97 F (36.1 C) (!) 32 99%  Intake/Output Summary (Last 24 hours) at 03-18-19 0902 Last data filed at 18-Mar-2019 0800 Gross per 24 hour  Intake 6341.37 ml  Output 2124 ml  Net 4217.37 ml   Abdomin mild firmness, not distended Groins with intact incisional vacs to suction.   150 cc vac output SS/bloody.   Feet warm with mild right 5th toe dusky, left multiple toes dusky.   Unable to obtain doppler signals  Assessment/Planning: Hypovolemic shock status post aortic endarterectomy and aortobifemoral bypass  Anuria Tolerating CRRT this am Continued on IV pressure support   HGB stable 11.9 this am surgical blood loss anemia.   Intubated     Roxy Horseman 2019-03-18 9:02 AM --  Laboratory Lab Results: Recent Labs    03/03/2019 0316  03-18-2019 0349 03-18-19 0436  WBC 27.2*  --  24.9*  --   HGB 12.9   < > 12.4 11.9*  HCT 37.6   < > 36.9 35.0*  PLT 75*  --  100*  --    < > = values in this interval not displayed.   BMET Recent Labs    02/22/2019 1518  Mar 18, 2019 0349 18-Mar-2019 0436  NA 148*   < > 143 141  K 5.2*   < > 4.2 4.2  CL 111  --  98  --   CO2 14*  --  22  --   GLUCOSE 83  --  104*  --   BUN 15  --  12  --   CREATININE 3.25*  --  2.58*  --   CALCIUM 7.9*  --  7.0*  --    < > = values in this interval not displayed.    COAG Lab Results  Component Value Date   INR 3.9 (H) 02/22/2019   INR 9.7 (HH) 03/13/2019   INR 1.6 (H) 03/05/2019   No results found for: PTT  POD #3 s/p aortic endarterectomy and aortobifemoral bypass for occlusive disease  N: Not following commands, no focal deficits, intubated CV: Hypotensive, now on 3 pressors levophed/neo/vasopressin - levophed 40, neo 140, vasopressin 0.03 Pulm: Intubated, Peep 8, FiO2 100% GI: NG, may start trickle feeds today Renal: Anuric, nephrology consulted, tolerated CRRT -  acidosis improving FEN: Acidotic, LA 11+, take back to OR yesterday, laparotomy with no source   58 year old female who remains critically ill after open aortic surgery.    Take back yesterday to the OR for laparotomy with no obvious source for her lactic acidosis.  Her abdomen was closed.  She did tolerate CRRT overnight.  Remains on 3 pressors critically ill with grim prognosis.  Lactic acid still elevated with no obvious source and assume is from the initial insult of surgery.  Difficulty finding pedal signals today but has excellent popliteal signals and I think it is from her vasospasm from pressor requirements.    Marty Heck, MD [facility] Office: [phone] Pager: [place]

## 2019-03-15 NOTE — Progress Notes (Signed)
CRITICAL VALUE ALERT  Critical Value:  Lactic acid >11  Date & Time Notied:  25-Mar-2019 1652   Provider Notified: Dr. Carlis Abbott.  Orders Received/Actions taken: No new orders. Patient to be made comfort care.  Joellen Jersey, RN

## 2019-03-15 NOTE — Progress Notes (Signed)
Patient continues to require additional pressor support. Unable to remove any fluid on CRRT. Bilat lower extremities with worsening mottling and duskiness on toes; no doppler pulses aside from popliteal. Midline abdominal incision with copious amounts of serosanguinous secretions - dressing has been saturated 3 times since the start of shift. O2 sats unable to be detected due to poor perfusion; have attempted multiple fingers, ear, and forehead probes. Core temp continues to decrease despite bair hugger. RN has discussed all these matters with patient's daughter Theadora Rama. She states she is waiting on her husband to arrive and she would then like to proceed with withdrawal of care. Will notify MD.   Joellen Jersey, RN

## 2019-03-15 NOTE — Progress Notes (Signed)
1054mcg IV fentanyl drip wasted in stericycle bin with Everlene Other, RN.  Joellen Jersey, RN

## 2019-03-15 NOTE — Progress Notes (Signed)
Dr. Carlis Abbott with CCM aware of patient's ABG results. Orders received to increase PEEP on vent to 12.  Joellen Jersey, RN

## 2019-03-15 NOTE — Progress Notes (Signed)
CRITICAL VALUE ALERT  Critical Value:  INR 5.1  Date & Time Notied:  03-17-19 1629  Provider Notified: Dr. Carlis Abbott to be notified.  Orders Received/Actions taken: Patient to become comfort care.   Joellen Jersey, RN

## 2019-03-15 NOTE — Plan of Care (Signed)
  Problem: Clinical Measurements: Goal: Diagnostic test results will improve Outcome: Progressing   Problem: Nutrition: Goal: Adequate nutrition will be maintained Outcome: Progressing Note: Will ideally start tube feeds today; waiting on vascular MD input.   Problem: Pain Managment: Goal: General experience of comfort will improve Outcome: Progressing   Problem: Clinical Measurements: Goal: Ability to maintain clinical measurements within normal limits will improve Outcome: Not Progressing Note: Labs improved, but continues to require more pressor support to continue with CRRT.

## 2019-03-15 DEATH — deceased

## 2021-04-07 IMAGING — DX PORTABLE CHEST - 1 VIEW
1 series · 1 of 1 positions shown · non-contrast
Comparison: February 14, 2018

CLINICAL DATA: Postop

EXAM:
PORTABLE CHEST 1 VIEW

[chest ap]
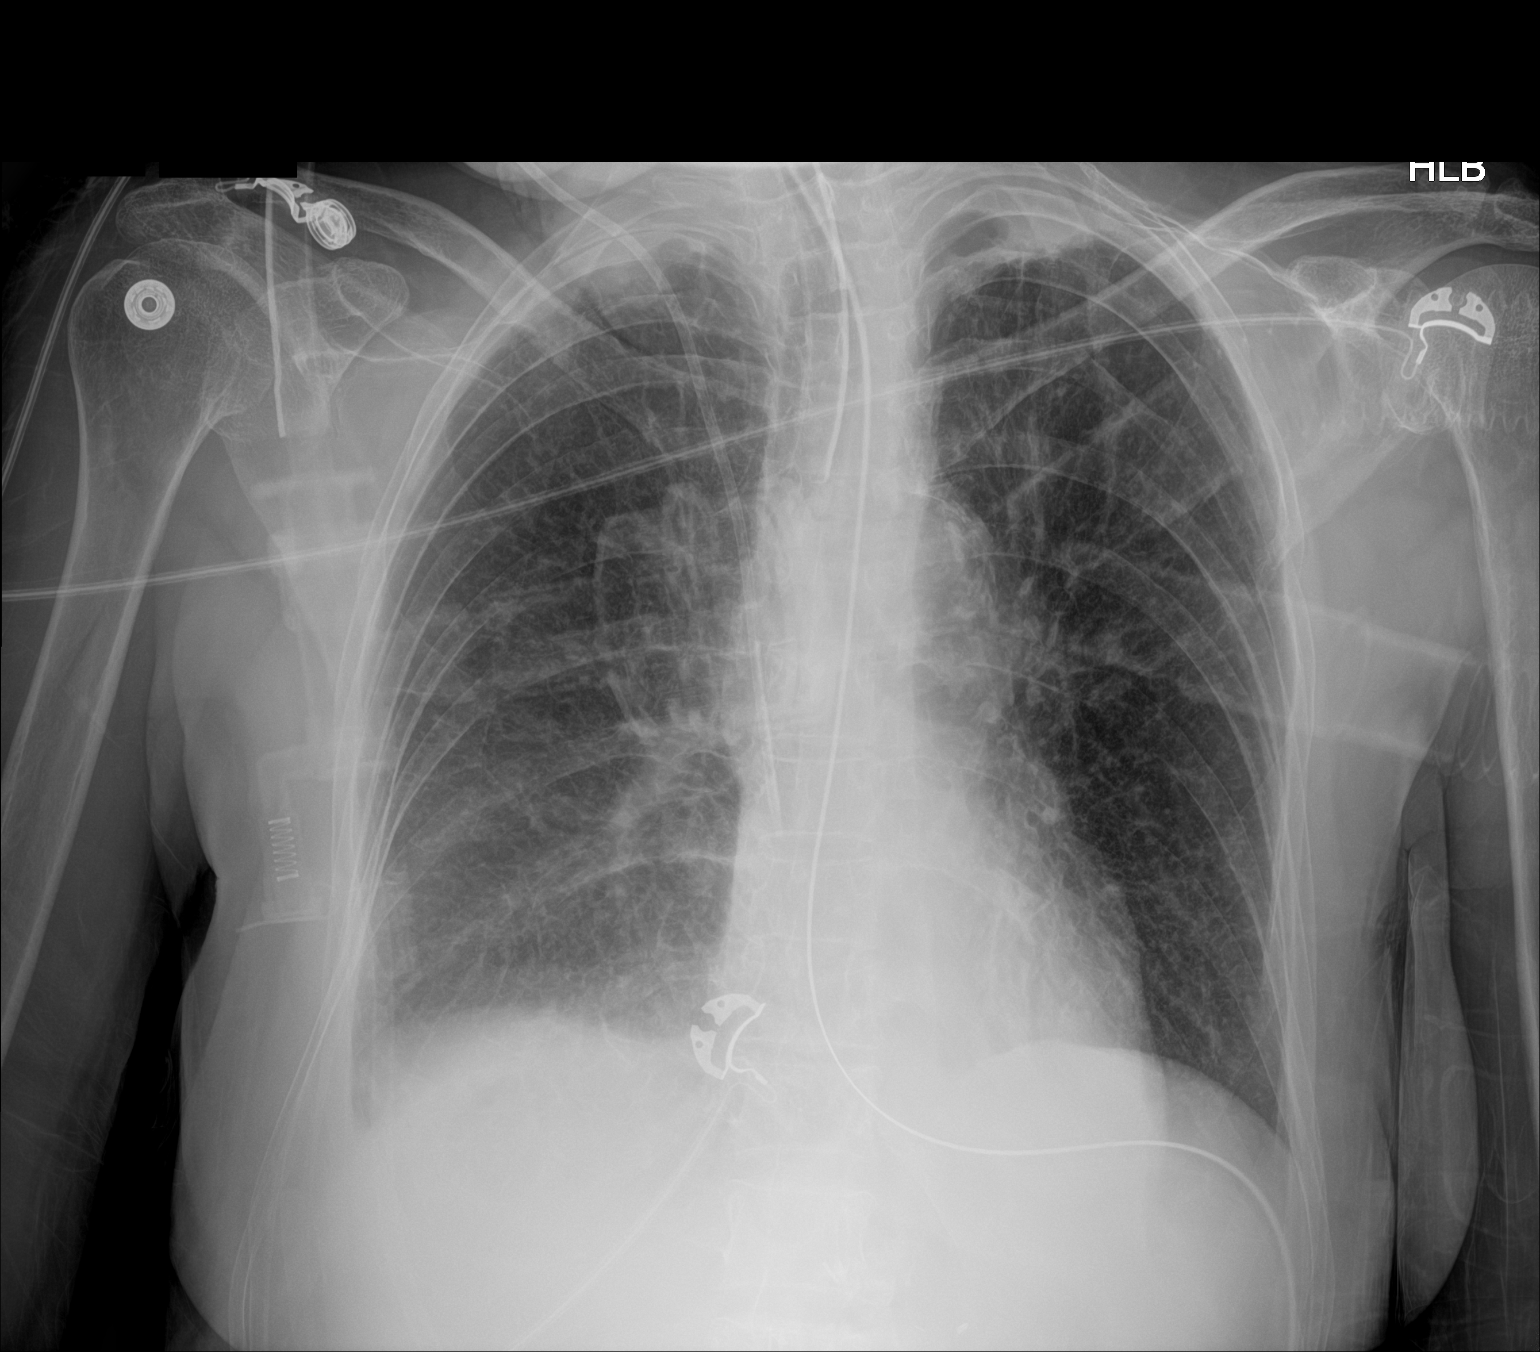

[1 of 1 positions shown; findings below may reference images not displayed]

FINDINGS: There is a right-sided central venous catheter with the tip at the
cavoatrial junction. ET tube is seen 1.5 cm above the level of the
carina. NG tube is seen coursing below the diaphragm. There is hazy
airspace opacity seen within the right lung base. There is a trace
right pleural effusion. Again noted is hyperinflation of the lung
zones with biapical scarring. Increased interstitial markings seen
throughout both lungs. The cardiomediastinal silhouette is
unchanged.
IMPRESSION: Central venous catheter with the tip at the superior cavoatrial
junction.

ET tube and enteric tube in satisfactory position

Hazy airspace opacity at the right lower lung which could be
layering effusion/atelectasis.

Trace right pleural effusion

Findings of COPD

## 2021-04-08 IMAGING — DX PORTABLE CHEST - 1 VIEW
1 series · 1 of 1 positions shown · non-contrast
Comparison: 02/21/2019

CLINICAL DATA: Status post dialysis catheter placement

EXAM:
PORTABLE CHEST 1 VIEW

[chest]
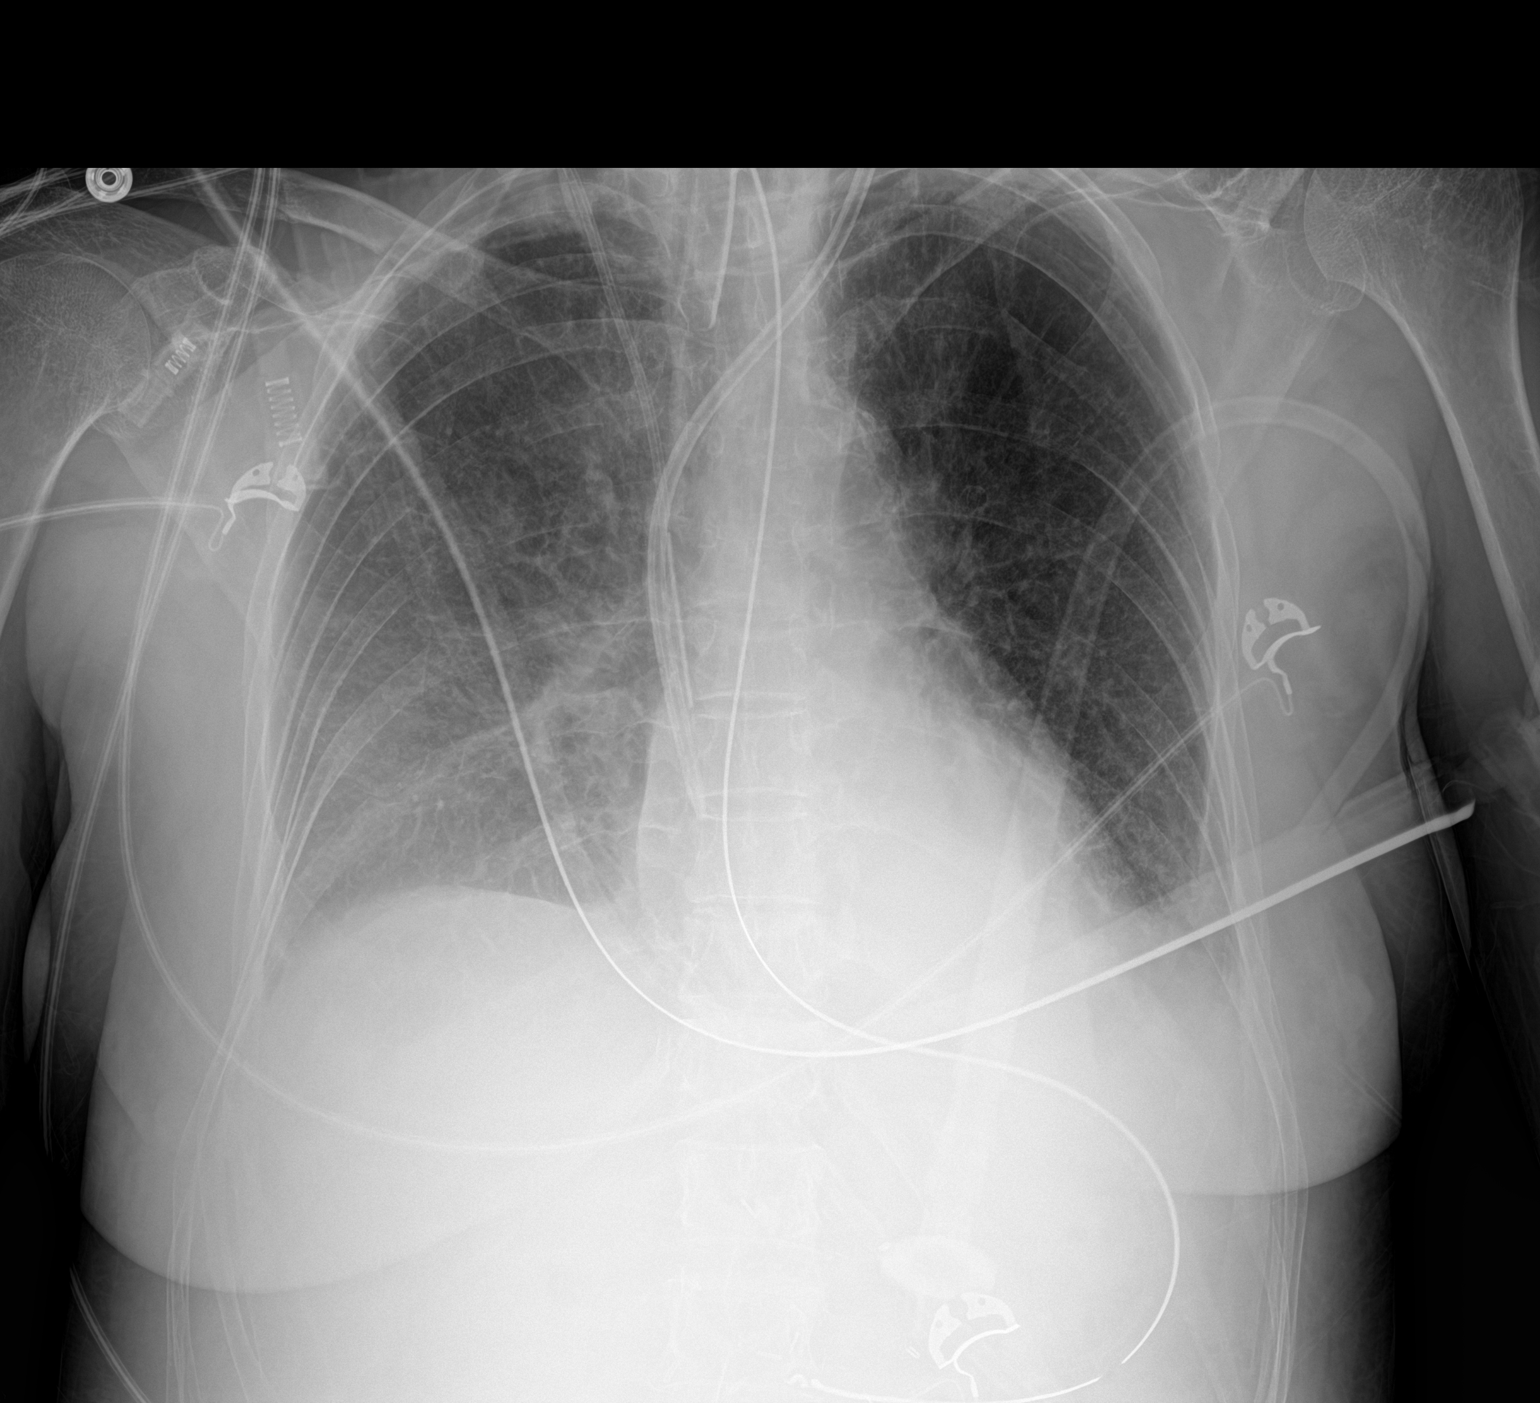

[1 of 1 positions shown; findings below may reference images not displayed]

FINDINGS: Endotracheal tube, gastric catheter and right jugular central line
are again noted and stable. New left jugular temporary dialysis
catheter is noted with the tip in the superior aspect of the right
atrium. No pneumothorax is seen. Hazy density over the bases
bilaterally likely related to small effusions.
IMPRESSION: No pneumothorax following central line placement.

Small effusions posteriorly.

## 2021-04-09 IMAGING — DX PORTABLE CHEST - 1 VIEW
1 series · 1 of 1 positions shown · non-contrast
Comparison: 02/22/2019.

CLINICAL DATA: Endotracheal tube, respiratory failure.

EXAM:
PORTABLE CHEST 1 VIEW

[chest]
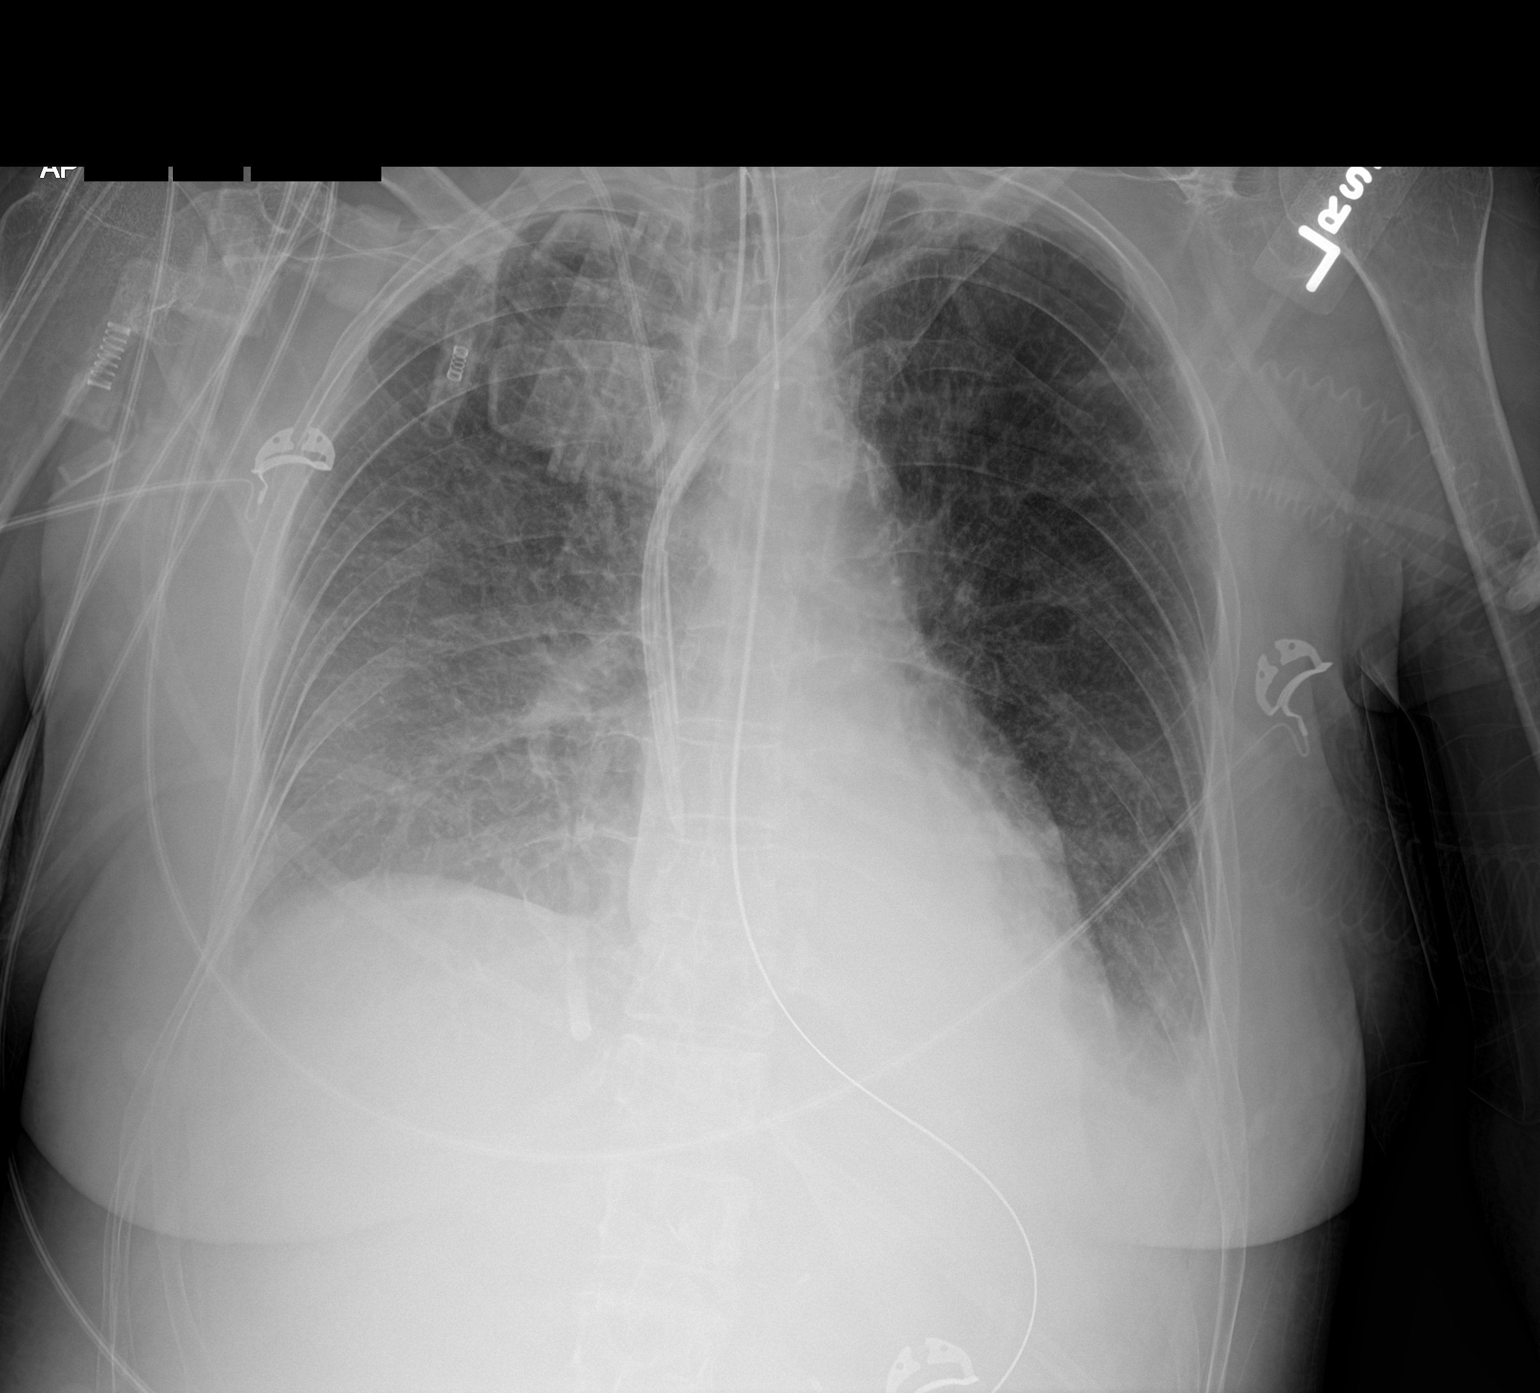

[1 of 1 positions shown; findings below may reference images not displayed]

FINDINGS: Endotracheal tube terminates approximately 5.5 cm above the carina.
Esophageal temperature probe is in place. Nasogastric tube is
followed into the stomach with the tip projecting beyond the
inferior margin of the image. Left IJ dialysis catheter terminates
in the right atrium. Right IJ central line tip terminates near the
SVC RA junction.

Heart size normal. Thoracic aorta is calcified. Left lower lobe
collapse/consolidation persists. Small bilateral pleural effusions,
layering on the right, similar. Biapical pleural thickening. No
pneumothorax.

Old left rib fractures.
IMPRESSION: 1. Left lower lobe collapse/consolidation, pneumonia cannot be
excluded.
2. Small bilateral pleural effusions, stable.
3.  Aortic atherosclerosis ([id number]-170.0).
